# Patient Record
Sex: Female | Born: 1955 | Race: White | Hispanic: No | Marital: Married | Smoking: Former smoker
Health system: Southern US, Community
[De-identification: ages and names within clinical notes are randomized; demographics above are authoritative.]

## PROBLEM LIST (undated history)

## (undated) DIAGNOSIS — M810 Age-related osteoporosis without current pathological fracture: Secondary | ICD-10-CM

## (undated) DIAGNOSIS — C4491 Basal cell carcinoma of skin, unspecified: Secondary | ICD-10-CM

## (undated) DIAGNOSIS — R251 Tremor, unspecified: Secondary | ICD-10-CM

## (undated) DIAGNOSIS — M1990 Unspecified osteoarthritis, unspecified site: Secondary | ICD-10-CM

## (undated) DIAGNOSIS — E039 Hypothyroidism, unspecified: Secondary | ICD-10-CM

## (undated) HISTORY — DX: Basal cell carcinoma of skin, unspecified: C44.91

## (undated) HISTORY — PX: ENDOMETRIAL ABLATION: SHX621

## (undated) HISTORY — DX: Age-related osteoporosis without current pathological fracture: M81.0

## (undated) HISTORY — DX: Hypothyroidism, unspecified: E03.9

## (undated) HISTORY — DX: Unspecified osteoarthritis, unspecified site: M19.90

## (undated) HISTORY — DX: Tremor, unspecified: R25.1

---

## 2000-11-06 ENCOUNTER — Other Ambulatory Visit: Admission: RE | Admit: 2000-11-06 | Discharge: 2000-11-06 | Payer: Self-pay | Admitting: Gynecology

## 2001-11-11 ENCOUNTER — Other Ambulatory Visit: Admission: RE | Admit: 2001-11-11 | Discharge: 2001-11-11 | Payer: Self-pay | Admitting: Gynecology

## 2002-11-30 ENCOUNTER — Other Ambulatory Visit: Admission: RE | Admit: 2002-11-30 | Discharge: 2002-11-30 | Payer: Self-pay | Admitting: Gynecology

## 2003-12-09 ENCOUNTER — Other Ambulatory Visit: Admission: RE | Admit: 2003-12-09 | Discharge: 2003-12-09 | Payer: Self-pay | Admitting: Gynecology

## 2004-12-11 ENCOUNTER — Other Ambulatory Visit: Admission: RE | Admit: 2004-12-11 | Discharge: 2004-12-11 | Payer: Self-pay | Admitting: Gynecology

## 2005-12-31 ENCOUNTER — Other Ambulatory Visit: Admission: RE | Admit: 2005-12-31 | Discharge: 2005-12-31 | Payer: Self-pay | Admitting: Gynecology

## 2007-04-24 HISTORY — PX: OTHER SURGICAL HISTORY: SHX169

## 2007-07-28 ENCOUNTER — Ambulatory Visit: Payer: Self-pay | Admitting: Internal Medicine

## 2007-07-28 DIAGNOSIS — N959 Unspecified menopausal and perimenopausal disorder: Secondary | ICD-10-CM | POA: Insufficient documentation

## 2007-08-04 ENCOUNTER — Encounter (INDEPENDENT_AMBULATORY_CARE_PROVIDER_SITE_OTHER): Payer: Self-pay | Admitting: *Deleted

## 2007-08-04 ENCOUNTER — Ambulatory Visit: Payer: Self-pay | Admitting: Gastroenterology

## 2007-08-11 ENCOUNTER — Encounter: Payer: Self-pay | Admitting: Internal Medicine

## 2007-08-11 ENCOUNTER — Encounter: Payer: Self-pay | Admitting: Gastroenterology

## 2007-08-11 ENCOUNTER — Ambulatory Visit: Payer: Self-pay | Admitting: Gastroenterology

## 2007-09-16 ENCOUNTER — Telehealth (INDEPENDENT_AMBULATORY_CARE_PROVIDER_SITE_OTHER): Payer: Self-pay | Admitting: *Deleted

## 2007-10-02 ENCOUNTER — Encounter: Payer: Self-pay | Admitting: Internal Medicine

## 2007-11-20 ENCOUNTER — Telehealth (INDEPENDENT_AMBULATORY_CARE_PROVIDER_SITE_OTHER): Payer: Self-pay | Admitting: *Deleted

## 2007-12-22 ENCOUNTER — Ambulatory Visit: Payer: Self-pay | Admitting: Internal Medicine

## 2007-12-22 DIAGNOSIS — M810 Age-related osteoporosis without current pathological fracture: Secondary | ICD-10-CM | POA: Insufficient documentation

## 2007-12-22 HISTORY — DX: Age-related osteoporosis without current pathological fracture: M81.0

## 2007-12-24 ENCOUNTER — Telehealth (INDEPENDENT_AMBULATORY_CARE_PROVIDER_SITE_OTHER): Payer: Self-pay | Admitting: *Deleted

## 2007-12-30 ENCOUNTER — Telehealth (INDEPENDENT_AMBULATORY_CARE_PROVIDER_SITE_OTHER): Payer: Self-pay | Admitting: *Deleted

## 2008-01-22 ENCOUNTER — Other Ambulatory Visit: Admission: RE | Admit: 2008-01-22 | Discharge: 2008-01-22 | Payer: Self-pay | Admitting: Obstetrics and Gynecology

## 2008-09-21 ENCOUNTER — Ambulatory Visit: Payer: Self-pay | Admitting: Internal Medicine

## 2008-09-21 DIAGNOSIS — Z860100 Personal history of colon polyps, unspecified: Secondary | ICD-10-CM | POA: Insufficient documentation

## 2008-09-23 ENCOUNTER — Encounter (INDEPENDENT_AMBULATORY_CARE_PROVIDER_SITE_OTHER): Payer: Self-pay | Admitting: *Deleted

## 2009-10-03 ENCOUNTER — Ambulatory Visit: Payer: Self-pay | Admitting: Internal Medicine

## 2009-10-07 LAB — CONVERTED CEMR LAB
ALT: 19 units/L (ref 0–35)
AST: 22 units/L (ref 0–37)
Albumin: 4.1 g/dL (ref 3.5–5.2)
Alkaline Phosphatase: 54 units/L (ref 39–117)
Basophils Absolute: 0 10*3/uL (ref 0.0–0.1)
Basophils Relative: 0.7 % (ref 0.0–3.0)
Bilirubin, Direct: 0.1 mg/dL (ref 0.0–0.3)
Cholesterol: 213 mg/dL — ABNORMAL HIGH (ref 0–200)
Direct LDL: 101.9 mg/dL
Eosinophils Absolute: 0.5 10*3/uL (ref 0.0–0.7)
Eosinophils Relative: 8.3 % — ABNORMAL HIGH (ref 0.0–5.0)
HCT: 40.4 % (ref 36.0–46.0)
HDL: 90.1 mg/dL (ref >39.00)
Hemoglobin: 13.9 g/dL (ref 12.0–15.0)
Lymphocytes Relative: 29.6 % (ref 12.0–46.0)
Lymphs Abs: 1.6 10*3/uL (ref 0.7–4.0)
MCHC: 34.4 g/dL (ref 30.0–36.0)
MCV: 94.6 fL (ref 78.0–100.0)
Monocytes Absolute: 0.4 10*3/uL (ref 0.1–1.0)
Monocytes Relative: 8 % (ref 3.0–12.0)
Neutro Abs: 2.9 10*3/uL (ref 1.4–7.7)
Neutrophils Relative %: 53.4 % (ref 43.0–77.0)
Platelets: 216 10*3/uL (ref 150.0–400.0)
RBC: 4.27 M/uL (ref 3.87–5.11)
RDW: 14.7 % — ABNORMAL HIGH (ref 11.5–14.6)
TSH: 6.5 microintl units/mL — ABNORMAL HIGH (ref 0.35–5.50)
Total Bilirubin: 0.4 mg/dL (ref 0.3–1.2)
Total CHOL/HDL Ratio: 2
Total Protein: 6.1 g/dL (ref 6.0–8.3)
Triglycerides: 37 mg/dL (ref 0.0–149.0)
VLDL: 7.4 mg/dL (ref 0.0–40.0)
WBC: 5.5 10*3/uL (ref 4.5–10.5)

## 2009-10-10 ENCOUNTER — Ambulatory Visit: Payer: Self-pay | Admitting: Internal Medicine

## 2009-10-10 ENCOUNTER — Telehealth (INDEPENDENT_AMBULATORY_CARE_PROVIDER_SITE_OTHER): Payer: Self-pay | Admitting: *Deleted

## 2009-10-10 DIAGNOSIS — E039 Hypothyroidism, unspecified: Secondary | ICD-10-CM

## 2009-10-10 DIAGNOSIS — M25819 Other specified joint disorders, unspecified shoulder: Secondary | ICD-10-CM | POA: Insufficient documentation

## 2009-10-10 DIAGNOSIS — M7580 Other shoulder lesions, unspecified shoulder: Secondary | ICD-10-CM

## 2009-10-10 HISTORY — DX: Hypothyroidism, unspecified: E03.9

## 2009-10-10 LAB — CONVERTED CEMR LAB: Vit D, 25-Hydroxy: 94 ng/mL — ABNORMAL HIGH (ref 30–89)

## 2009-10-27 ENCOUNTER — Encounter: Payer: Self-pay | Admitting: Internal Medicine

## 2010-01-09 ENCOUNTER — Ambulatory Visit: Payer: Self-pay | Admitting: Internal Medicine

## 2010-01-09 ENCOUNTER — Telehealth (INDEPENDENT_AMBULATORY_CARE_PROVIDER_SITE_OTHER): Payer: Self-pay | Admitting: *Deleted

## 2010-04-27 ENCOUNTER — Encounter: Payer: Self-pay | Admitting: Internal Medicine

## 2010-04-27 ENCOUNTER — Other Ambulatory Visit: Payer: Self-pay | Admitting: Internal Medicine

## 2010-04-27 ENCOUNTER — Ambulatory Visit
Admission: RE | Admit: 2010-04-27 | Discharge: 2010-04-27 | Payer: Self-pay | Source: Home / Self Care | Attending: Internal Medicine | Admitting: Internal Medicine

## 2010-04-27 LAB — T4, FREE: Free T4: 0.86 ng/dL (ref 0.60–1.60)

## 2010-04-27 LAB — TSH: TSH: 7.66 u[IU]/mL — ABNORMAL HIGH (ref 0.35–5.50)

## 2010-04-27 LAB — T3, FREE: T3, Free: 2.5 pg/mL (ref 2.3–4.2)

## 2010-05-04 LAB — CONVERTED CEMR LAB: Vit D, 25-Hydroxy: 84 ng/mL (ref 30–89)

## 2010-05-21 LAB — CONVERTED CEMR LAB
ALT: 18 units/L (ref 0–35)
ALT: 19 units/L (ref 0–35)
AST: 23 units/L (ref 0–37)
AST: 24 units/L (ref 0–37)
Albumin: 4.1 g/dL (ref 3.5–5.2)
Albumin: 4.3 g/dL (ref 3.5–5.2)
Alkaline Phosphatase: 112 units/L (ref 39–117)
Alkaline Phosphatase: 56 units/L (ref 39–117)
BUN: 12 mg/dL (ref 6–23)
BUN: 17 mg/dL (ref 6–23)
Basophils Absolute: 0 10*3/uL (ref 0.0–0.1)
Basophils Absolute: 0 10*3/uL (ref 0.0–0.1)
Basophils Relative: 0.1 % (ref 0.0–3.0)
Basophils Relative: 0.2 % (ref 0.0–1.0)
Bilirubin, Direct: 0.1 mg/dL (ref 0.0–0.3)
Bilirubin, Direct: 0.1 mg/dL (ref 0.0–0.3)
CO2: 29 meq/L (ref 19–32)
CO2: 31 meq/L (ref 19–32)
Calcium: 9.1 mg/dL (ref 8.4–10.5)
Calcium: 9.2 mg/dL (ref 8.4–10.5)
Chloride: 108 meq/L (ref 96–112)
Chloride: 109 meq/L (ref 96–112)
Cholesterol: 180 mg/dL (ref 0–200)
Cholesterol: 213 mg/dL — ABNORMAL HIGH (ref 0–200)
Creatinine, Ser: 0.8 mg/dL (ref 0.4–1.2)
Creatinine, Ser: 0.8 mg/dL (ref 0.4–1.2)
Direct LDL: 105.9 mg/dL
Eosinophils Absolute: 0.1 10*3/uL (ref 0.0–0.7)
Eosinophils Absolute: 0.1 10*3/uL (ref 0.0–0.7)
Eosinophils Relative: 2 % (ref 0.0–5.0)
Eosinophils Relative: 2.1 % (ref 0.0–5.0)
Free T4: 0.95 ng/dL (ref 0.60–1.60)
GFR calc Af Amer: 97 mL/min
GFR calc non Af Amer: 79.94 mL/min (ref >60)
GFR calc non Af Amer: 80 mL/min
Glucose, Bld: 86 mg/dL (ref 70–99)
Glucose, Bld: 90 mg/dL (ref 70–99)
HCT: 40.6 % (ref 36.0–46.0)
HCT: 40.9 % (ref 36.0–46.0)
HDL: 89.2 mg/dL (ref >39.0)
HDL: 96.2 mg/dL (ref >39.00)
Hemoglobin: 13.6 g/dL (ref 12.0–15.0)
Hemoglobin: 13.9 g/dL (ref 12.0–15.0)
LDL Cholesterol: 82 mg/dL (ref 0–99)
Lymphocytes Relative: 36.2 % (ref 12.0–46.0)
Lymphocytes Relative: 40 % (ref 12.0–46.0)
Lymphs Abs: 2 10*3/uL (ref 0.7–4.0)
MCHC: 33.1 g/dL (ref 30.0–36.0)
MCHC: 34.2 g/dL (ref 30.0–36.0)
MCV: 93.3 fL (ref 78.0–100.0)
MCV: 95.3 fL (ref 78.0–100.0)
Monocytes Absolute: 0.4 10*3/uL (ref 0.1–1.0)
Monocytes Absolute: 0.4 10*3/uL (ref 0.1–1.0)
Monocytes Relative: 8 % (ref 3.0–12.0)
Monocytes Relative: 8.7 % (ref 3.0–12.0)
Neutro Abs: 2.6 10*3/uL (ref 1.4–7.7)
Neutro Abs: 2.8 10*3/uL (ref 1.4–7.7)
Neutrophils Relative %: 49.9 % (ref 43.0–77.0)
Neutrophils Relative %: 52.8 % (ref 43.0–77.0)
Platelets: 214 10*3/uL (ref 150.0–400.0)
Platelets: 253 10*3/uL (ref 150–400)
Potassium: 3.7 meq/L (ref 3.5–5.1)
Potassium: 3.9 meq/L (ref 3.5–5.1)
RBC: 4.3 M/uL (ref 3.87–5.11)
RBC: 4.35 M/uL (ref 3.87–5.11)
RDW: 13 % (ref 11.5–14.6)
RDW: 13.1 % (ref 11.5–14.6)
Sodium: 142 meq/L (ref 135–145)
Sodium: 144 meq/L (ref 135–145)
T3, Free: 2.5 pg/mL (ref 2.3–4.2)
TSH: 4.49 microintl units/mL (ref 0.35–5.50)
TSH: 5.33 microintl units/mL (ref 0.35–5.50)
TSH: 5.96 microintl units/mL — ABNORMAL HIGH (ref 0.35–5.50)
Total Bilirubin: 0.6 mg/dL (ref 0.3–1.2)
Total Bilirubin: 1.2 mg/dL (ref 0.3–1.2)
Total CHOL/HDL Ratio: 2
Total CHOL/HDL Ratio: 2
Total Protein: 6.8 g/dL (ref 6.0–8.3)
Total Protein: 7 g/dL (ref 6.0–8.3)
Triglycerides: 34 mg/dL (ref 0.0–149.0)
Triglycerides: 46 mg/dL (ref 0–149)
VLDL: 6.8 mg/dL (ref 0.0–40.0)
VLDL: 9 mg/dL (ref 0–40)
Vit D, 1,25-Dihydroxy: 53 (ref 30–89)
Vit D, 25-Hydroxy: 78 ng/mL (ref 30–89)
WBC: 5.1 10*3/uL (ref 4.5–10.5)
WBC: 5.1 10*3/uL (ref 4.5–10.5)

## 2010-05-23 NOTE — Assessment & Plan Note (Signed)
Summary: cpx/kdc   Vital Signs:  Patient profile:   55 year old female Height:      64.25 inches Weight:      118.4 pounds BMI:     20.24 Temp:     98.3 degrees F oral Pulse rate:   60 / minute Resp:     14 per minute BP sitting:   104 / 66  (left arm) Cuff size:   regular  Vitals Entered By: Shonna Chock (October 10, 2009 2:26 PM)  Comments REVIEWED MED LIST, PATIENT AGREED DOSE AND INSTRUCTION CORRECT    History of Present Illness: Candice Brennan is here for  a physical; she is asymptomatic.  Allergies: 1)  ! Pcn  Past History:  Past Medical History: Osteoporosis 7/ 2009: T score: -2.6 @ femoral neck (vit D level 53) Colonic polyps, hx of,2009, Dr Christella Hartigan  Past Surgical History: gravida 0, Patty Grubb,Gyn Endometriosis; laparoscopic  laser surgery, Dr Lillard Anes  [place] Colonoscopy age 36: "spastic colon" Colon polypectomy 2009 ,Dr Christella Hartigan, F/U 2019  Family History: father: thyroid issues, CAD, aneursym ( AAA) and aneurysms in  legs mother: hypertension, breast cancer, osteoporosis,hypothyroidism  sister: lymphedema ? etiology PGM: MI d 69;PGF: MI d 89; MGM :CVA;MGF :CVA  Social History: Former Smoker quit : @ age 6 Alcohol use-socially: wine with dinner Retired Married Regular exercise-yes: walking 3-4X/week X 30 min, hand weights & calisthentics  Review of Systems General:  Denies chills, fatigue, fever, sleep disorder, sweats, and weight loss. Eyes:  Denies blurring, double vision, and vision loss-both eyes. ENT:  Denies decreased hearing, difficulty swallowing, earache, hoarseness, and ringing in ears. CV:  Denies bluish discoloration of lips or nails, chest pain or discomfort, leg cramps with exertion, shortness of breath with exertion, and swelling of feet; Occasional edema of hands in heat. Resp:  Denies cough, shortness of breath, and sputum productive. GI:  Denies abdominal pain, bloody stools, constipation, dark tarry stools, diarrhea, and indigestion. GU:   Denies discharge, dysuria, and hematuria. MS:  Complains of low back pain; denies joint pain, joint redness, joint swelling, mid back pain, and thoracic pain; Decreased ROM of L shoulder. PMH of steroid injection 2000.Marland Kitchen Derm:  Denies changes in nail beds, dryness, hair loss, lesion(s), and rash. Neuro:  Denies numbness, tingling, and weakness. Psych:  Denies anxiety and depression. Endo:  Denies cold intolerance, excessive hunger, excessive thirst, excessive urination, and heat intolerance; Feet cold with cooler environments ; no Raynaud's. Heme:  Denies abnormal bruising and bleeding. Allergy:  Denies itching eyes and sneezing.  Physical Exam  General:  well-nourished, alert,appropriate and cooperative throughout examination Head:  Normocephalic and atraumatic without obvious abnormalities.  Eyes:  No corneal or conjunctival inflammation noted. Marland Kitchen Perrla. Funduscopic exam benign, without hemorrhages, exudates or papilledema.  Ears:  External ear exam shows no significant lesions or deformities.  Otoscopic examination reveals clear canals, tympanic membranes are intact bilaterally without bulging, retraction, inflammation or discharge. Hearing is grossly normal bilaterally. Nose:  External nasal examination shows no deformity or inflammation. Nasal mucosa are pink and moist without lesions or exudates. Mouth:  Oral mucosa and oropharynx without lesions or exudates.  Teeth in good repair. Neck:  No deformities, masses, or tenderness noted.Cervical rib suggested on L Lungs:  Normal respiratory effort, chest expands symmetrically. Lungs are clear to auscultation, no crackles or wheezes. Heart:  Normal rate and regular rhythm. S1 and S2 normal without gallop, murmur, click, rub. S4 Abdomen:  Bowel sounds positive,abdomen soft and non-tender without masses, organomegaly  or hernias noted. Aorta palpable w/o AAA Msk:  No deformity or scoliosis noted of thoracic or lumbar spine.   Pulses:  R and L  carotid,radial and posterior tibial pulses are full and equal bilaterally. Decreased DPP w/o ischemic changes. Extremities:  No clubbing, cyanosis, edema, or deformity noted with normal full range of motion of all joints. High arches . Excellent nail health. No crepitus L shoulder ; normal ROM  Neurologic:  alert & oriented X3, strength normal in all extremities, and DTRs symmetrical and normal.   Skin:  Intact without suspicious lesions or rashes Cervical Nodes:  No lymphadenopathy noted Axillary Nodes:  No palpable lymphadenopathy Psych:  memory intact for recent and remote, normally interactive, and good eye contact.     Impression & Recommendations:  Problem # 1:  ROUTINE GENERAL MEDICAL EXAM@HEALTH  CARE FACL (ICD-V70.0)  Orders: EKG w/ Interpretation (93000)  Problem # 2:  OSTEOPOROSIS (ICD-733.00)  Her updated medication list for this problem includes:    Fosamax 70 Mg Tabs (Alendronate sodium) .Marland Kitchen... 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 1 by mouth once daily 2)  B Complex 100 Tabs (B complex vitamins) .Marland Kitchen.. 1 by mouth once daily 3)  Vitamin C 500 Mg Tabs (Ascorbic acid) .Marland Kitchen.. 1 by mouth once daily 4)  Calcium 600/vitamin D 600-400 Mg-unit Tabs (Calcium carbonate-vitamin d) .... 2 by mouth once daily 5)  Vitamin D3 2000 Unit Caps (Cholecalciferol) .Marland Kitchen.. 1 by mouth once daily 6)  Vitamin E 400 Unit Caps (Vitamin e) .Marland Kitchen.. 1 by mouth once daily 7)  Fish Oil 1000 Mg Caps (Omega-3 fatty acids) .... 2 by mouth once daily 8)  Estroven Maximum Strength  Tabs (Nutritional supplements) .Marland Kitchen.. 1 by mouth two times a day 9)  Coq10 100 Mg Caps (coenzyme Q10)/ubiguinol  .Marland Kitchen.. 1 by mouth once daily 10)  Ibuprofen 200 Mg Tabs (Ibuprofen) .Marland Kitchen.. 1 by mouth once daily as needed 11)  Edamame(dry Roasted Soy Beans)  12)  Fosamax 70 Mg Tabs (Alendronate sodium) .Marland Kitchen.. 1 every week 13)  Ocuvite-lutein Caps (Multiple vitamins-minerals) .Marland Kitchen.. 1 by mouth once daily 14)  Triple Flex 750-400-375 Mg Tabs (Glucosamine-chondroitin-msm) .... 2 by mouth once daily  Patient Instructions: 1)  Schedule BMD with mammogram . Glucosamine sulfate 1500 mg once daily ; 3 months on &  then 2 months off.Physical Therapy if shoulder  no better.

## 2010-05-23 NOTE — Assessment & Plan Note (Signed)
Summary: FLU SHOT///SPH  Nurse Visit   Allergies: 1)  ! Pcn  Orders Added: 1)  Admin 1st Vaccine [90471] 2)  Flu Vaccine 86yrs + [16109] Flu Vaccine Consent Questions     Do you have a history of severe allergic reactions to this vaccine? no    Any prior history of allergic reactions to egg and/or gelatin? no    Do you have a sensitivity to the preservative Thimersol? no    Do you have a past history of Guillan-Barre Syndrome? no    Do you currently have an acute febrile illness? no    Have you ever had a severe reaction to latex? no    Vaccine information given and explained to patient? yes    Are you currently pregnant? no    Lot Number:[id number]   Exp Date:10/20/2009   Site Given  Left Deltoid IMu Vaccine 69yrs + [60454] .lbflu

## 2010-05-23 NOTE — Progress Notes (Signed)
----   Converted from flag ---- ---- 10/10/2009 9:18 AM, Okey Regal [place] wrote: patient has appt (med refill)  [id number]  ---- 10/07/2009 4:17 PM, Marga Melnick MD wrote: please verify F/U appt ------------------------------

## 2010-05-23 NOTE — Progress Notes (Signed)
Summary: Triage: ? about supplement  Phone Note Call from Patient Call back at Home Phone [phone]   Caller: Patient Summary of Call: Patient would like to know what Dr.Hopper thinks about the Supplement Trigosamine   Dr.Hopper please advise./Chrae [facility] CMA  January 09, 2010 8:59 AM   Follow-up for Phone Call        I have to admit ignorance. Consider consulting WebMD Follow-up by: Marga Melnick MD,  January 09, 2010 1:15 PM  Additional Follow-up for Phone Call Additional follow up Details #1::        left message on machine .....Marland KitchenMarland KitchenDoristine Devoid CMA  January 09, 2010 4:29 PM   spoke w/ patient aware of recommendations.......Marland KitchenDoristine Devoid CMA  January 10, 2010 10:52 AM

## 2010-05-29 ENCOUNTER — Ambulatory Visit (INDEPENDENT_AMBULATORY_CARE_PROVIDER_SITE_OTHER): Payer: BC Managed Care – PPO | Admitting: Internal Medicine

## 2010-05-29 ENCOUNTER — Encounter: Payer: Self-pay | Admitting: Internal Medicine

## 2010-05-29 DIAGNOSIS — R946 Abnormal results of thyroid function studies: Secondary | ICD-10-CM

## 2010-06-08 NOTE — Assessment & Plan Note (Signed)
Summary: discuss labs///sph  Nurse Visit   Vital Signs:  Patient profile:   55 year old female Weight:      119.2 pounds BMI:     20.38 Pulse rate:   64 / minute Resp:     14 per minute BP sitting:   98 / 66  (left arm) Cuff size:   regular  Vitals Entered By: Shonna Chock CMA (May 29, 2010 2:00 PM)  CC:  Discuss labs (patient with mailed copy).  History of Present Illness:    Candice Brennan lost her father CAD  in 03/2010; diet  & CVE have  been suboptimal. Labs reviewed ; TSH is in HYPOthyroid range; it has climbed from 5.96 to 7.66. See ROS. She feels most of her symptoms are due to menopause.   Review of Systems General:  Complains of fatigue; denies weight loss. Eyes:  Denies blurring, double vision, and vision loss-both eyes. ENT:  Denies difficulty swallowing and hoarseness. CV:  Denies palpitations. GI:  Denies constipation and diarrhea. Derm:  Complains of hair loss; denies changes in nail beds and dryness. Neuro:  Denies numbness and tingling. Endo:  Denies cold intolerance and heat intolerance.   Physical Exam  General:  Thin but well-nourished; alert,appropriate and cooperative throughout examination Eyes:  No corneal or conjunctival inflammation noted. No lid lag Neck:  No deformities, masses, or tenderness noted. Physiologic asymmetry of thyroid w/o nodules Heart:  Normal rate and regular rhythm. S1 and S2 normal without gallop, murmur, click, rub . S4 Extremities:  trace left pedal edema and trace right pedal edema.   Neurologic:  alert & oriented X3 and DTRs symmetrical and normal.   No tremor Skin:  Intact without suspicious lesions or rashes Psych:  memory intact for recent and remote, normally interactive, and good eye contact.     Impression & Recommendations:  Problem # 1:  THYROID FUNCTION TEST, ABNORMAL (ICD-794.5) progressive  rise in TSH  Complete Medication List: 1)  Multivitamins Tabs (Multiple vitamin) .Marland Kitchen.. 1 by mouth once daily 2)   Vitamin B-12 1000 Mcg Tabs (Cyanocobalamin) .Marland Kitchen.. 1 by mouth once daily 3)  Vitamin C 500 Mg Tabs (Ascorbic acid) .Marland Kitchen.. 1 by mouth once daily 4)  Calcium 600/vitamin D 600-400 Mg-unit Tabs (Calcium carbonate-vitamin d) .... 2 by mouth once daily 5)  Vitamin D3 1000 Unit Tabs (Cholecalciferol) .Marland Kitchen.. 1 by mouth once daily 6)  Fish Oil 1000 Mg Caps (Omega-3 fatty acids) .... 2 by mouth once daily 7)  Estroven Maximum Strength Tabs (Nutritional supplements) .Marland Kitchen.. 1 by mouth once daily 8)  Coq10 100 Mg Caps (coenzyme Q10)/ubiguinol  .Marland Kitchen.. 1 by mouth once daily 9)  Fosamax 70 Mg Tabs (Alendronate sodium) .Marland Kitchen.. 1 every week 10)  Ocuvite-lutein Caps (Multiple vitamins-minerals) .Marland Kitchen.. 1 by mouth once daily 11)  Triple Flex 750-400-375 Mg Tabs (Glucosamine-chondroitin-msm) .... 2 by mouth once daily 12)  Levothroid 25 Mcg Tabs (Levothyroxine sodium) .Marland Kitchen.. 1 once daily    Patient Instructions: 1)  Consider low dose thyroid replacement  with TSH after  8-10 weeks (244.9) .If  not taken  AT LEAST monitor TFTs every 4 months.  CC: Discuss labs (patient with mailed copy)   Current Medications (verified): 1)  Multivitamins  Tabs (Multiple Vitamin) .Marland Kitchen.. 1 By Mouth Once Daily 2)  Vitamin B-12 1000 Mcg Tabs (Cyanocobalamin) .Marland Kitchen.. 1 By Mouth Once Daily 3)  Vitamin C 500 Mg Tabs (Ascorbic Acid) .Marland Kitchen.. 1 By Mouth Once Daily 4)  Calcium 600/vitamin D 600-400 Mg-Unit Tabs (  Calcium Carbonate-Vitamin D) .... 2 By Mouth Once Daily 5)  Vitamin D3 1000 Unit Tabs (Cholecalciferol) .Marland Kitchen.. 1 By Mouth Once Daily 6)  Fish Oil 1000 Mg Caps (Omega-3 Fatty Acids) .... 2 By Mouth Once Daily 7)  Estroven Maximum Strength  Tabs (Nutritional Supplements) .Marland Kitchen.. 1 By Mouth Once Daily 8)  Coq10 100 Mg Caps (Coenzyme Q10)/ubiguinol .Marland Kitchen.. 1 By Mouth Once Daily 9)  Fosamax 70 Mg Tabs (Alendronate Sodium) .Marland Kitchen.. 1 Every Week 10)  Ocuvite-Lutein  Caps (Multiple Vitamins-Minerals) .Marland Kitchen.. 1 By Mouth Once Daily 11)  Triple Flex 750-400-375 Mg Tabs  (Glucosamine-Chondroitin-Msm) .... 2 By Mouth Once Daily  Allergies: 1)  ! Pcn  Orders Added: 1)  Est. Patient Level III [04540] Prescriptions: LEVOTHROID 25 MCG TABS (LEVOTHYROXINE SODIUM) 1 once daily  #90 x 0   Entered and Authorized by:   Marga Melnick MD   Signed by:   Marga Melnick MD on 05/29/2010   Method used:   Print then Give to Patient   RxID:   [id number]

## 2010-08-18 ENCOUNTER — Other Ambulatory Visit: Payer: Self-pay | Admitting: Internal Medicine

## 2010-08-24 ENCOUNTER — Other Ambulatory Visit (INDEPENDENT_AMBULATORY_CARE_PROVIDER_SITE_OTHER): Payer: BC Managed Care – PPO

## 2010-08-24 DIAGNOSIS — E039 Hypothyroidism, unspecified: Secondary | ICD-10-CM

## 2010-08-24 LAB — TSH: TSH: 4.07 u[IU]/mL (ref 0.35–5.50)

## 2010-11-01 ENCOUNTER — Other Ambulatory Visit: Payer: Self-pay | Admitting: Internal Medicine

## 2010-11-01 NOTE — Telephone Encounter (Signed)
TSH 244.9 

## 2011-02-01 ENCOUNTER — Ambulatory Visit (INDEPENDENT_AMBULATORY_CARE_PROVIDER_SITE_OTHER): Payer: BC Managed Care – PPO

## 2011-02-01 DIAGNOSIS — Z23 Encounter for immunization: Secondary | ICD-10-CM

## 2011-05-08 ENCOUNTER — Other Ambulatory Visit: Payer: Self-pay | Admitting: Internal Medicine

## 2011-05-08 NOTE — Telephone Encounter (Signed)
TSH 244.9 

## 2011-06-15 ENCOUNTER — Other Ambulatory Visit: Payer: Self-pay | Admitting: Internal Medicine

## 2011-07-17 ENCOUNTER — Other Ambulatory Visit: Payer: Self-pay | Admitting: Internal Medicine

## 2011-07-18 NOTE — Telephone Encounter (Signed)
TSH 244.9 

## 2011-08-17 ENCOUNTER — Other Ambulatory Visit: Payer: Self-pay | Admitting: Internal Medicine

## 2011-08-20 NOTE — Telephone Encounter (Signed)
TSH 244.9 

## 2011-09-18 ENCOUNTER — Other Ambulatory Visit: Payer: Self-pay | Admitting: Internal Medicine

## 2011-10-04 ENCOUNTER — Encounter: Payer: Self-pay | Admitting: Internal Medicine

## 2011-10-04 ENCOUNTER — Ambulatory Visit (INDEPENDENT_AMBULATORY_CARE_PROVIDER_SITE_OTHER): Payer: BC Managed Care – PPO | Admitting: Internal Medicine

## 2011-10-04 VITALS — BP 100/60 | HR 72 | Temp 98.0°F | Resp 16 | Ht 64.0 in | Wt 112.6 lb

## 2011-10-04 DIAGNOSIS — R946 Abnormal results of thyroid function studies: Secondary | ICD-10-CM

## 2011-10-04 DIAGNOSIS — Z0000 Encounter for general adult medical examination without abnormal findings: Secondary | ICD-10-CM

## 2011-10-04 DIAGNOSIS — M810 Age-related osteoporosis without current pathological fracture: Secondary | ICD-10-CM

## 2011-10-04 NOTE — Progress Notes (Signed)
  Subjective:    Patient ID: Candice Brennan, female    DOB: Dec 24, 1955, 56 y.o.   MRN: [id number]  HPI  Mrs. Poteat is here for a physical;acute issues include grinding in L jaw. She questions relationship to Fosamax.      Review of Systems Thyroid function monitor  Medications status(change in dose/brand/mode of administration):no Constitutional: Weight change:down 3 #; Fatigue:no; Sleep pattern:yes; Appetite:yes  Visual change(blurred/diplopia/visual loss):no Hoarseness:no; Swallowing issues:no Cardiovascular: Palpitations:no; Racing:no; Irregularity:no GI: Constipation:no; Diarrhea:no Derm: Change in nails/hair/skin:no Neuro: Numbness/tingling:no; Tremor:no Psych: Anxiety:concerned about mother's health due to repeated falls, MRSA, renal failure; Depression:no Endo: Temperature intolerance: Heat:no; Cold:yes       Objective:   Physical Exam Gen.: Thin but healthy and well-nourished in appearance. Alert, appropriate and cooperative throughout exam. Head: Normocephalic without obvious abnormalities  Eyes: No corneal or conjunctival inflammation noted. Pupils equal round reactive to light and accommodation. Fundal exam is benign without hemorrhages, exudate, papilledema. Extraocular motion intact. Vision grossly normal with lenses. Ears: External  ear exam reveals no significant lesions or deformities. Canals clear .TMs normal. Hearing is grossly normal bilaterally. Nose: External nasal exam reveals no deformity or inflammation. Nasal mucosa are pink and moist. No lesions or exudates noted.  Mouth: Oral mucosa and oropharynx reveal no lesions or exudates. Teeth in good repair. Mild classic dislocation of left temporal mandibular joint with mouth opening Neck: No deformities, masses, or tenderness noted. Range of motion & Thyroid normal. Lungs: Normal respiratory effort; chest expands symmetrically. Lungs are clear to auscultation without rales, wheezes, or increased work of  breathing. Heart: Normal rate and rhythm. Normal S1 and S2. No gallop,  or rub. Loud click the apex intermittently; no murmur or regurgitation.  Abdomen: Bowel sounds normal; abdomen soft and nontender. No masses, organomegaly or hernias noted. Genitalia: Shirlyn Goltz, NP                                                               Musculoskeletal/extremities: No clubbing, cyanosis, edema, or deformity noted. Range of motion  normal .Tone & strength  normal.Joints normal. Nail health  good. Vascular: Carotid, radial artery, dorsalis pedis and  posterior tibial pulses are full and equal. No bruits present. Neurologic: Alert and oriented x3. Deep tendon reflexes symmetrical and normal.          Skin: Intact without suspicious lesions or rashes. Lymph: No cervical, axillary lymphadenopathy present. Psych: Mood and affect are normal. 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 This is not related to the Fosamax. Plan: see Orders

## 2011-10-04 NOTE — Patient Instructions (Addendum)
Preventive Health Care: Exercise  30-45  minutes a day, 3-4 days a week. Walking is especially valuable in preventing Osteoporosis. Eat a low-fat diet with lots of fruits and vegetables, up to 7-9 servings per day. Consume less than 30 grams of sugar per day from foods & drinks with High Fructose Corn Syrup as # 1,2,3 or #4 on label. Go to WebMD for information about temporomandibular joint dysfunction.Consider glucosamine sulfate 1500 mg daily for TMJ symptoms. Take this daily  for 3 months and then leave it off for 2 months. This will rehydrate the cartilage @ the joint.  Please try to go on My Chart within the next 24 hours to allow me to release the results directly to you.

## 2011-10-05 ENCOUNTER — Other Ambulatory Visit: Payer: Self-pay | Admitting: *Deleted

## 2011-10-05 ENCOUNTER — Ambulatory Visit (INDEPENDENT_AMBULATORY_CARE_PROVIDER_SITE_OTHER)
Admission: RE | Admit: 2011-10-05 | Discharge: 2011-10-05 | Disposition: A | Discharge status: Home / Self Care | Payer: BC Managed Care – PPO | Source: Ambulatory Visit | Attending: Internal Medicine | Admitting: Internal Medicine

## 2011-10-05 DIAGNOSIS — M810 Age-related osteoporosis without current pathological fracture: Secondary | ICD-10-CM

## 2011-10-05 LAB — CBC WITH DIFFERENTIAL/PLATELET
Basophils Absolute: 0 10*3/uL (ref 0.0–0.1)
HCT: 39.9 % (ref 36.0–46.0)
Lymphs Abs: 2 10*3/uL (ref 0.7–4.0)
MCHC: 32.9 g/dL (ref 30.0–36.0)
MCV: 93.8 fl (ref 78.0–100.0)
Monocytes Absolute: 0.7 10*3/uL (ref 0.1–1.0)
Platelets: 216 10*3/uL (ref 150.0–400.0)
RDW: 14.3 % (ref 11.5–14.6)

## 2011-10-05 LAB — LIPID PANEL
Cholesterol: 191 mg/dL (ref 0–200)
LDL Cholesterol: 79 mg/dL (ref 0–99)
Triglycerides: 52 mg/dL (ref 0.0–149.0)
VLDL: 10.4 mg/dL (ref 0.0–40.0)

## 2011-10-05 LAB — BASIC METABOLIC PANEL
BUN: 19 mg/dL (ref 6–23)
Chloride: 104 mEq/L (ref 96–112)
Glucose, Bld: 74 mg/dL (ref 70–99)
Potassium: 3.9 mEq/L (ref 3.5–5.1)

## 2011-10-05 LAB — HEPATIC FUNCTION PANEL
ALT: 17 U/L (ref 0–35)
Total Bilirubin: 0.7 mg/dL (ref 0.3–1.2)

## 2011-10-05 NOTE — Progress Notes (Signed)
Christine from [employer] called regarding patient in office to have DEXA and Order entry wrong in EMR & no appointment in system, inform that [facility] had been out of office & regular nurse for MD has also been out of office this week. Radiology at [facility] requesting call back from OM [Nancy] to discuss this matter as it seems to be an ongoing problem; OM informed/SLS

## 2012-01-11 ENCOUNTER — Other Ambulatory Visit: Payer: Self-pay | Admitting: Internal Medicine

## 2012-01-16 ENCOUNTER — Other Ambulatory Visit: Payer: Self-pay | Admitting: Internal Medicine

## 2012-01-17 ENCOUNTER — Other Ambulatory Visit: Payer: Self-pay

## 2012-01-17 MED ORDER — ALENDRONATE SODIUM 70 MG PO TABS: 70.0000 mg | ORAL_TABLET | ORAL | Status: DC

## 2012-01-17 NOTE — Telephone Encounter (Signed)
Deleted the Rx so it wouldn't be sent twice.   MW

## 2012-01-17 NOTE — Telephone Encounter (Signed)
Rx sent.    MW 

## 2012-01-17 NOTE — Telephone Encounter (Signed)
Plz close encounter for me I already filled Rx request and this is duplicate encounter. Thx   MW

## 2012-01-23 LAB — HM MAMMOGRAPHY

## 2012-02-05 ENCOUNTER — Ambulatory Visit (INDEPENDENT_AMBULATORY_CARE_PROVIDER_SITE_OTHER): Payer: BC Managed Care – PPO

## 2012-02-05 DIAGNOSIS — Z23 Encounter for immunization: Secondary | ICD-10-CM

## 2012-03-15 ENCOUNTER — Other Ambulatory Visit: Payer: Self-pay | Admitting: Internal Medicine

## 2012-05-29 ENCOUNTER — Telehealth: Payer: Self-pay | Admitting: Internal Medicine

## 2012-05-29 NOTE — Telephone Encounter (Signed)
Okay; she needs to document the dates that she has taken the medication for future reference concerning her bone integrity and progression of any bone loss.

## 2012-05-29 NOTE — Telephone Encounter (Signed)
Discuss with patient  

## 2012-05-29 NOTE — Telephone Encounter (Signed)
Patient states she believes her jaw problems are caused by fosamax and would like to stop taking it. She is scheduled to take it tomorrow morning and she does not want to take it. Patient would like to know if that is okay.

## 2012-10-13 ENCOUNTER — Other Ambulatory Visit: Payer: Self-pay | Admitting: Internal Medicine

## 2012-11-08 ENCOUNTER — Other Ambulatory Visit: Payer: Self-pay | Admitting: Internal Medicine

## 2012-12-29 ENCOUNTER — Encounter: Payer: Self-pay | Admitting: Internal Medicine

## 2012-12-29 ENCOUNTER — Ambulatory Visit (INDEPENDENT_AMBULATORY_CARE_PROVIDER_SITE_OTHER): Payer: BC Managed Care – PPO | Admitting: Internal Medicine

## 2012-12-29 VITALS — BP 107/71 | HR 72 | Temp 97.6°F | Ht 64.25 in | Wt 117.4 lb

## 2012-12-29 DIAGNOSIS — Z8669 Personal history of other diseases of the nervous system and sense organs: Secondary | ICD-10-CM

## 2012-12-29 DIAGNOSIS — Z0000 Encounter for general adult medical examination without abnormal findings: Secondary | ICD-10-CM

## 2012-12-29 MED ORDER — LEVOTHYROXINE SODIUM 25 MCG PO TABS: ORAL_TABLET | ORAL | Status: DC

## 2012-12-29 NOTE — Patient Instructions (Addendum)
If you activate the  My Chart system; lab & Xray results will be released directly  to you as soon as I review & address these through the computer. If you choose not to sign up for My Chart within 36 hours of labs being drawn; results will be reviewed & interpretation added before being copied & mailed, causing a delay in getting the results to you.If you do not receive that report within 7-10 days ,please call. Additionally you can use this system to gain direct  access to your records  if  out of town or @ an office of a  physician who is not in  the My Chart network.  This improves continuity of care & places you in control of your medical record.  To prevent intention tremor, avoid stimulants such as decongestants, diet pills, nicotine, or caffeine (coffee, tea, cola, or chocolate) to excess.

## 2012-12-29 NOTE — Progress Notes (Signed)
Subjective:    Patient ID: Candice Brennan, female    DOB: July 10, 1955, 57 y.o.   MRN: [id number]  HPI Candice Brennan is here for a physical;acute issues include intermittent tremor of her head and also some fine tremor with intentional hand activity intermittently. She questions whether these might be related to her thyroid.     Review of Systems  There has been no change in the dose brand mode of administration of thyroid supplement  Constitutional: No significant change in weight; significant fatigue; sleep disorder; change in appetite. Eye: no blurred, double ,loss of vision Cardiovascular: no palpitations; rare racing @ rest ENT/GI: no constipation; diarrhea;hoarseness;dysphagia Derm: no change in nails,hair,skin Neuro: no numbness or tingling Psych:no anxiety; depression; panic attacks Endo: no temperature intolerance to heat ,cold       Objective:   Physical Exam Gen.: Thin but healthy and well-nourished in appearance. Alert, appropriate and cooperative throughout exam.Appears younger than stated age  Head: Normocephalic without obvious abnormalities  Eyes: No corneal or conjunctival inflammation noted. Pupils equal round reactive to light and accommodation.  Extraocular motion intact. Vision grossly normal with lenses Ears: External  ear exam reveals no significant lesions or deformities. Canals clear .TMs normal. Hearing is grossly normal bilaterally. Nose: External nasal exam reveals no deformity or inflammation. Nasal mucosa are pink and moist. No lesions or exudates noted.   Mouth: Oral mucosa and oropharynx reveal no lesions or exudates. Teeth in good repair. Neck: No deformities, masses, or tenderness noted. Range of motion & Thyroid normal. Lungs: Normal respiratory effort; chest expands symmetrically. Lungs are clear to auscultation without rales, wheezes, or increased work of breathing. Heart: Normal rate and rhythm. Normal S1 and S2. No gallop, click, or rub. No  murmur. Abdomen: Bowel sounds normal; abdomen soft and nontender. No masses, organomegaly or hernias noted.Aorta palpable ; no AAA Genitalia: As per Gyn                                  Musculoskeletal/extremities: No deformity or scoliosis noted of  the thoracic or lumbar spine.  No clubbing, cyanosis, edema, or significant extremity  deformity noted. Range of motion normal .Tone & strength  Normal. Joints normal . Minor knee crepitus. Nail health good; no onycholysis. Able to lie down & sit up w/o help. Negative SLR bilaterally Vascular: Carotid, radial artery, dorsalis pedis and  posterior tibial pulses are full and equal. No bruits present. Neurologic: Alert and oriented x3. Deep tendon reflexes symmetrical and normal.        Skin: Intact without suspicious lesions or rashes. Lymph: Pea sized  L cervical chain but no axillary lymphadenopathy present. Psych: Mood and affect are normal. 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 Potential risk discussed  Plan: see Orders  & Recommendations

## 2012-12-30 ENCOUNTER — Other Ambulatory Visit (INDEPENDENT_AMBULATORY_CARE_PROVIDER_SITE_OTHER): Payer: BC Managed Care – PPO

## 2012-12-30 DIAGNOSIS — Z0000 Encounter for general adult medical examination without abnormal findings: Secondary | ICD-10-CM

## 2012-12-30 LAB — HEPATIC FUNCTION PANEL
ALT: 27 U/L (ref 0–35)
AST: 31 U/L (ref 0–37)
Albumin: 4.3 g/dL (ref 3.5–5.2)
Total Bilirubin: 0.7 mg/dL (ref 0.3–1.2)
Total Protein: 6.4 g/dL (ref 6.0–8.3)

## 2012-12-30 LAB — LDL CHOLESTEROL, DIRECT: Direct LDL: 101.5 mg/dL

## 2012-12-30 LAB — LIPID PANEL
Cholesterol: 218 mg/dL — ABNORMAL HIGH (ref 0–200)
Total CHOL/HDL Ratio: 2

## 2013-01-02 ENCOUNTER — Encounter: Payer: Self-pay | Admitting: Internal Medicine

## 2013-01-02 LAB — VITAMIN D 1,25 DIHYDROXY
Vitamin D2 1, 25 (OH)2: <8 pg/mL
Vitamin D3 1, 25 (OH)2: 50 pg/mL

## 2013-01-05 ENCOUNTER — Telehealth: Payer: Self-pay | Admitting: *Deleted

## 2013-01-05 ENCOUNTER — Encounter: Payer: Self-pay | Admitting: *Deleted

## 2013-01-05 NOTE — Telephone Encounter (Signed)
Message copied by Baldwin [place] on Mon Jan 05, 2013  3:09 PM ------      Message from: Pecola Lawless      Created: Fri Jan 02, 2013  5:44 PM       LDL or BAD cholesterol is MINIMALLY elevated , but the HDL (GOOD) > 50 is protective.       TSH (Thyroid Stimulating Hormone) normal range = 0.35- 5.50. Ideal value is 1-3.        A  Value  > 5.50 indicates HYPOthyroid state from decreased production of thyroid gland  hormones or  suboptimal thyroid hormone replacement. Verify your present dose of thyroid is 25 mcg daily. Increase this to one daily except one & one half on Tuesday and Thursday. Recheck TSH in 4-6 months. Code 244.9.      All other lab results are excellent. Hopp       ------

## 2013-01-05 NOTE — Telephone Encounter (Signed)
Letter with lab results sent as well as message left on voice mail

## 2013-01-06 ENCOUNTER — Telehealth: Payer: Self-pay | Admitting: Internal Medicine

## 2013-01-06 NOTE — Telephone Encounter (Signed)
Patient called stating she needs [place] to send new rx for levothyroxine with new directions (see lab results). She uses [facility] in [place].

## 2013-01-08 ENCOUNTER — Other Ambulatory Visit: Payer: Self-pay | Admitting: *Deleted

## 2013-01-08 DIAGNOSIS — E039 Hypothyroidism, unspecified: Secondary | ICD-10-CM

## 2013-01-08 MED ORDER — LEVOTHYROXINE SODIUM 25 MCG PO TABS: ORAL_TABLET | ORAL | Status: DC

## 2013-01-08 NOTE — Telephone Encounter (Signed)
Refill for synthroid sent to [facility], detailed message left on pt home voice mail with instructions on taking 1 tab daily except on Tue and Thur when she is to take 1.5 tablets.

## 2013-02-05 ENCOUNTER — Ambulatory Visit (INDEPENDENT_AMBULATORY_CARE_PROVIDER_SITE_OTHER): Payer: BC Managed Care – PPO

## 2013-02-05 DIAGNOSIS — Z23 Encounter for immunization: Secondary | ICD-10-CM

## 2013-05-05 ENCOUNTER — Other Ambulatory Visit (INDEPENDENT_AMBULATORY_CARE_PROVIDER_SITE_OTHER): Payer: BC Managed Care – PPO

## 2013-05-05 DIAGNOSIS — E889 Metabolic disorder, unspecified: Secondary | ICD-10-CM

## 2013-05-06 LAB — TSH: TSH: 5.11 u[IU]/mL (ref 0.35–5.50)

## 2013-07-21 ENCOUNTER — Encounter: Payer: Self-pay | Admitting: Internal Medicine

## 2013-07-21 ENCOUNTER — Ambulatory Visit (INDEPENDENT_AMBULATORY_CARE_PROVIDER_SITE_OTHER): Payer: BC Managed Care – PPO | Admitting: Internal Medicine

## 2013-07-21 VITALS — BP 113/70 | HR 75 | Temp 97.9°F | Ht 64.0 in | Wt 118.0 lb

## 2013-07-21 DIAGNOSIS — R259 Unspecified abnormal involuntary movements: Secondary | ICD-10-CM

## 2013-07-21 DIAGNOSIS — R251 Tremor, unspecified: Secondary | ICD-10-CM

## 2013-07-21 DIAGNOSIS — R946 Abnormal results of thyroid function studies: Secondary | ICD-10-CM

## 2013-07-21 HISTORY — DX: Tremor, unspecified: R25.1

## 2013-07-21 NOTE — Progress Notes (Signed)
Pre visit review using our clinic review tool, if applicable. No additional management support is needed unless otherwise documented below in the visit note. 

## 2013-07-21 NOTE — Assessment & Plan Note (Signed)
Mild hypothyroidism, on replacement, TSH goal 3.0 (discussed goal of TSH 1.0 but  patient does not like to take "too much medication")

## 2013-07-21 NOTE — Assessment & Plan Note (Signed)
One-year history of action tremor, not affecting her activities of daily living feeding, neurological exam today is essentially normal. No much evidence of Parkinson disease. Likely she has essential tremor. Diagnosis and prognosis discussed. Plan: Observation for now.

## 2013-07-21 NOTE — Patient Instructions (Signed)
Get your blood work before you leave   Next visit is for routine check up regards your  thyroid  in 4 months  No need to come back fasting Please make an appointment    Tremor Tremor is a rhythmic, involuntary muscular contraction characterized by oscillations (to-and-fro movements) of a part of the body. The most common of all involuntary movements, tremor can affect various body parts such as the hands, head, facial structures, vocal cords, trunk, and legs; most tremors, however, occur in the hands. Tremor often accompanies neurological disorders associated with aging. Although the disorder is not life-threatening, it can be responsible for functional disability and social embarrassment. TREATMENT  There are many types of tremor and several ways in which tremor is classified. The most common classification is by behavioral context or position. There are five categories of tremor within this classification: resting, postural, kinetic, task-specific, and psychogenic. Resting or static tremor occurs when the muscle is at rest, for example when the hands are lying on the lap. This type of tremor is often seen in patients with Parkinson's disease. Postural tremor occurs when a patient attempts to maintain posture, such as holding the hands outstretched. Postural tremors include physiological tremor, essential tremor, tremor with basal ganglia disease (also seen in patients with Parkinson's disease), cerebellar postural tremor, tremor with peripheral neuropathy, post-traumatic tremor, and alcoholic tremor. Kinetic or intention (action) tremor occurs during purposeful movement, for example during finger-to-nose testing. Task-specific tremor appears when performing goal-oriented tasks such as handwriting, speaking, or standing. This group consists of primary writing tremor, vocal tremor, and orthostatic tremor. Psychogenic tremor occurs in both older and younger patients. The key feature of this tremor is that it  dramatically lessens or disappears when the patient is distracted. PROGNOSIS There are some treatment options available for tremor; the appropriate treatment depends on accurate diagnosis of the cause. Some tremors respond to treatment of the underlying condition, for example in some cases of psychogenic tremor treating the patient's underlying mental problem may cause the tremor to disappear. Also, patients with tremor due to Parkinson's disease may be treated with Levodopa drug therapy. Symptomatic drug therapy is available for several other tremors as well. For those cases of tremor in which there is no effective drug treatment, physical measures such as teaching the patient to brace the affected limb during the tremor are sometimes useful. Surgical intervention such as thalamotomy or deep brain stimulation may be useful in certain cases. Document Released: 03/30/2002 Document Revised: 07/02/2011 Document Reviewed: 04/09/2005 [facility] Patient Information 2014 [place].

## 2013-07-21 NOTE — Progress Notes (Signed)
Subjective:    Patient ID: Candice Brennan, female    DOB: 1955-08-03, 58 y.o.   MRN: [id number]  DOS:  07/21/2013 Type of  visit:    New patient, transferring from Dr. Linna Darner Thyroid disease, on Synthroid, last TSH elevated and dose was adjusted. Good compliance Also one-year history of tremor, mostly in her hands and the head, only with action, only when she does something tedious or use  her hands a lot. Not affecting her activities of daily living. She admits to stress, mom is at a assisted-living facility, stress seems to increase the tremor. Denies depression or gait disturbances per se.    ROS   Past Medical History  Diagnosis Date  . Osteopenia     -2.4 @ L fem neck  . OSTEOPOROSIS 12/22/2007    Qualifier: Diagnosis of  By: Linna Darner MD, Gwyndolyn Saxon  10/05/2011 : T score - 2.4 L femoral neck BMD @ [place] Fosamax initiated 08/ 2009 ; stopped 05/2012 Family history of osteoporosis in her mother and 2 maternal aunts.    . Tremor 07/21/2013    Past Surgical History  Procedure Laterality Date  . Endometrial ablation      laparoscopic  . Colonoscopy with polypectomy  2009    Dr Ardis Hughs     History   Social History  . Marital Status: Married    Spouse Name: N/A    Number of Children: 0  . Years of Education: N/A   Occupational History  . retired -- Pharmacist, community     Social History Main Topics  . Smoking status: Former Smoker    Quit date: 04/24/1983  . Smokeless tobacco: Never Used     Comment: smoked [place], up to 1 ppd  . Alcohol Use: Yes     Comment:  occasionally  . Drug Use: Not on file  . Sexual Activity: Not on file   Other Topics Concern  . Not on file   Social History Narrative   Has 1 step daughter         Medication List       This list is accurate as of: 07/21/13  5:37 PM.  Always use your most recent med list.               Calcium Carbonate-Vit D-Min 600-400 MG-UNIT Tabs  Take 2 tablets by mouth daily.     cholecalciferol  1000 UNITS tablet  Commonly known as:  VITAMIN D  Take 1,000 Units by mouth daily.     Cinnamon 500 MG Tabs  Take 1 tablet by mouth 2 (two) times daily.     fish oil-omega-3 fatty acids 1000 MG capsule  Take 2 g by mouth daily.     levothyroxine 25 MCG tablet  Commonly known as:  SYNTHROID, LEVOTHROID  TAKE ONE TABLET BY MOUTH ONCE DAILY, Tuesday-Thursday takes 1.5 tablets     LUTEIN-ZEAXANTHIN PO  Take 1 tablet by mouth daily.     multivitamin tablet  Take 1 tablet by mouth daily.     NON FORMULARY  Take 1 tablet by mouth at bedtime. GHR Take as directed.     NON FORMULARY  Take 2 tablets by mouth every morning. Omega Q Plus     vitamin C 1000 MG tablet  Take 1,000 mg by mouth daily.     vitamin E 400 UNIT capsule  Take 400 Units by mouth daily.           Objective:   Physical  Exam BP 113/70  Pulse 75  Temp(Src) 97.9 F (36.6 C)  Ht 5\' 4"  (1.626 m)  Wt 118 lb (53.524 kg)  BMI 20.24 kg/m2  SpO2 98%  General -- alert, well-developed, NAD.  Neck --no thyromegaly  HEENT-- Not pale  Lungs -- normal respiratory effort, no intercostal retractions, no accessory muscle use, and normal breath sounds.  Heart-- normal rate, regular rhythm, no murmur.  Extremities-- no pretibial edema bilaterally  Neurologic--  alert & oriented X3. Speech normal, gait normal, strength normal in all extremities.  DTRs symmetric  Face motor normal, no rigidity No tremor on today's exam Psych-- Cognition and judgment appear intact. Cooperative with normal attention span and concentration. No anxious or depressed appearing.     Assessment & Plan:    Today , I spent more than  25 min with the patient, >50% of the time counseling, had multiple questions about thyroid dz and tremors; also  reviewing the chart and labs ordered by other providers

## 2013-07-22 LAB — TSH: TSH: 2.72 u[IU]/mL (ref 0.35–5.50)

## 2013-11-10 ENCOUNTER — Encounter: Payer: Self-pay | Admitting: Internal Medicine

## 2013-11-10 ENCOUNTER — Ambulatory Visit (INDEPENDENT_AMBULATORY_CARE_PROVIDER_SITE_OTHER): Payer: BC Managed Care – PPO | Admitting: Internal Medicine

## 2013-11-10 VITALS — BP 98/70 | HR 63 | Temp 98.6°F | Wt 115.2 lb

## 2013-11-10 DIAGNOSIS — R946 Abnormal results of thyroid function studies: Secondary | ICD-10-CM

## 2013-11-10 DIAGNOSIS — Z0000 Encounter for general adult medical examination without abnormal findings: Secondary | ICD-10-CM | POA: Insufficient documentation

## 2013-11-10 DIAGNOSIS — M810 Age-related osteoporosis without current pathological fracture: Secondary | ICD-10-CM

## 2013-11-10 LAB — TSH: TSH: 1.98 u[IU]/mL (ref 0.35–4.50)

## 2013-11-10 NOTE — Progress Notes (Signed)
Subjective:    Patient ID: Candice Brennan, female    DOB: 01-09-1956, 58 y.o.   MRN: [id number]  DOS:  11/10/2013 Type of visit - description: ROV History: Hypothyroidism, good medication compliance. Osteopenia, chart reviewed, see assessment and plan   ROS In general feeling well, denies chest pain or difficulty breathing. No anxiety- depression   Past Medical History  Diagnosis Date  . Osteopenia     -2.4 @ L fem neck  . OSTEOPOROSIS 12/22/2007    Qualifier: Diagnosis of  By: Linna Darner MD, Gwyndolyn Saxon  10/05/2011 : T score - 2.4 L femoral neck BMD @ [place] Fosamax initiated 08/ 2009 ; stopped 05/2012 Family history of osteoporosis in her mother and 2 maternal aunts.    . Tremor 07/21/2013    Past Surgical History  Procedure Laterality Date  . Endometrial ablation      laparoscopic  . Colonoscopy with polypectomy  2009    Dr Ardis Hughs     History   Social History  . Marital Status: Married    Spouse Name: N/A    Number of Children: 0  . Years of Education: N/A   Occupational History  . retired -- Pharmacist, community     Social History Main Topics  . Smoking status: Former Smoker    Quit date: 04/24/1983  . Smokeless tobacco: Never Used     Comment: smoked [place], up to 1 ppd  . Alcohol Use: Yes     Comment:  occasionally  . Drug Use: Not on file  . Sexual Activity: Not on file   Other Topics Concern  . Not on file   Social History Narrative   Has 1 step daughter         Medication List       This list is accurate as of: 11/10/13  6:04 PM.  Always use your most recent med list.               Calcium Carbonate-Vit D-Min 600-400 MG-UNIT Tabs  Take 2 tablets by mouth daily.     cholecalciferol 1000 UNITS tablet  Commonly known as:  VITAMIN D  Take 1,000 Units by mouth daily.     Cinnamon 500 MG Tabs  Take 1 tablet by mouth 2 (two) times daily.     levothyroxine 25 MCG tablet  Commonly known as:  SYNTHROID, LEVOTHROID  TAKE ONE TABLET BY  MOUTH ONCE DAILY, Tuesday-Thursday takes 1.5 tablets     LUTEIN-ZEAXANTHIN PO  Take 1 tablet by mouth daily.     multivitamin tablet  Take 1 tablet by mouth daily.     NON FORMULARY  Take 2 tablets by mouth every morning. Omega Q Plus     NON FORMULARY  Co-Q10 100mg  Take 2 tsp in the morning.     OSTEO BI-FLEX TRIPLE STRENGTH PO  Take 1 tablet by mouth daily.     Turmeric Curcumin 500 MG Caps  Take 1 capsule by mouth daily.           Objective:   Physical Exam BP 98/70  Pulse 63  Temp(Src) 98.6 F (37 C) (Oral)  Wt 115 lb 3.2 oz (52.254 kg)  SpO2 97% General -- alert, well-developed, NAD.  Neck --no thyromegaly  Lungs -- normal respiratory effort, no intercostal retractions, no accessory muscle use, and normal breath sounds.  Heart-- normal rate, regular rhythm, no murmur.  Extremities-- no pretibial edema bilaterally  Neurologic--  alert & oriented X3. Speech normal, gait appropriate  for age, strength symmetric and appropriate for age.   Psych-- Cognition and judgment appear intact. Cooperative with normal attention span and concentration. No anxious or depressed appearing.     Assessment & Plan:

## 2013-11-10 NOTE — Patient Instructions (Signed)
Get your blood work before you leave   Next visit is for a physical exam by September 2015, fasting Please make an appointment

## 2013-11-10 NOTE — Assessment & Plan Note (Signed)
Due for a physical in few months.

## 2013-11-10 NOTE — Assessment & Plan Note (Signed)
Good med compliance, check a TSH 

## 2013-11-10 NOTE — Assessment & Plan Note (Signed)
At some point had osteoporosis, Bone density test 2013 show a T score of -2.4, osteopenia. Status post Fosamax for 5 years. Patient will be reluctant to go back to Fosamax, she has some TMJ issues and she thinks is due to Fosamax.

## 2013-11-10 NOTE — Progress Notes (Signed)
Pre visit review using our clinic review tool, if applicable. No additional management support is needed unless otherwise documented below in the visit note. 

## 2013-11-12 ENCOUNTER — Telehealth: Payer: Self-pay | Admitting: Internal Medicine

## 2013-11-12 MED ORDER — LEVOTHYROXINE SODIUM 25 MCG PO TABS: ORAL_TABLET | ORAL | Status: DC

## 2013-11-12 NOTE — Telephone Encounter (Signed)
Caller name: Barri Relation to JE:HUDJSHF Call back number: [phone] Pharmacy:levothyroxine (SYNTHROID, LEVOTHROID) 25 MCG tablet   Reason for call: To request a refill for levothyroxine (SYNTHROID, LEVOTHROID) 25 MCG tablet

## 2013-11-12 NOTE — Telephone Encounter (Signed)
Med refilled.

## 2013-12-31 ENCOUNTER — Encounter: Payer: BC Managed Care – PPO | Admitting: Internal Medicine

## 2014-01-05 ENCOUNTER — Ambulatory Visit (INDEPENDENT_AMBULATORY_CARE_PROVIDER_SITE_OTHER): Payer: BC Managed Care – PPO | Admitting: Internal Medicine

## 2014-01-05 ENCOUNTER — Encounter: Payer: Self-pay | Admitting: Internal Medicine

## 2014-01-05 VITALS — BP 112/68 | HR 65 | Temp 98.5°F | Ht 64.0 in | Wt 115.5 lb

## 2014-01-05 DIAGNOSIS — Z23 Encounter for immunization: Secondary | ICD-10-CM

## 2014-01-05 DIAGNOSIS — Z0000 Encounter for general adult medical examination without abnormal findings: Secondary | ICD-10-CM

## 2014-01-05 DIAGNOSIS — M810 Age-related osteoporosis without current pathological fracture: Secondary | ICD-10-CM

## 2014-01-05 LAB — LIPID PANEL
CHOLESTEROL: 216 mg/dL — AB (ref 0–200)
HDL: 92.7 mg/dL (ref >39.00)
LDL Cholesterol: 116 mg/dL — ABNORMAL HIGH (ref 0–99)
NonHDL: 123.3
Total CHOL/HDL Ratio: 2
Triglycerides: 36 mg/dL (ref 0.0–149.0)
VLDL: 7.2 mg/dL (ref 0.0–40.0)

## 2014-01-05 LAB — COMPREHENSIVE METABOLIC PANEL
ALT: 17 U/L (ref 0–35)
AST: 24 U/L (ref 0–37)
Albumin: 4.2 g/dL (ref 3.5–5.2)
Alkaline Phosphatase: 68 U/L (ref 39–117)
BUN: 14 mg/dL (ref 6–23)
CHLORIDE: 101 meq/L (ref 96–112)
CO2: 29 mEq/L (ref 19–32)
Calcium: 9.3 mg/dL (ref 8.4–10.5)
Creatinine, Ser: 0.8 mg/dL (ref 0.4–1.2)
GFR: 76.19 mL/min (ref >60.00)
Glucose, Bld: 81 mg/dL (ref 70–99)
Potassium: 3.8 mEq/L (ref 3.5–5.1)
Sodium: 139 mEq/L (ref 135–145)
Total Bilirubin: 0.8 mg/dL (ref 0.2–1.2)
Total Protein: 6.3 g/dL (ref 6.0–8.3)

## 2014-01-05 NOTE — Patient Instructions (Signed)
Get your blood work before you leave      Please come back to the office in 6 months for a routine office (thyroid), no fasting    Stop by the front desk and schedule the visit

## 2014-01-05 NOTE — Progress Notes (Signed)
Subjective:    Patient ID: Candice Brennan, female    DOB: 06/05/1955, 58 y.o.   MRN: [id number]  DOS:  01/05/2014 Type of visit - description : CPX, here w/ her husband Interval history: feeling well  ROS   No  CP, SOB Denies  nausea, vomiting diarrhea, blood in the stools (-) cough, sputum production (-) wheezing, chest congestion No dysuria, gross hematuria, difficulty urinating  No vaginal discharge, bleed. No anxiety, depression  No headaches . Denies dizziness     Past Medical History  Diagnosis Date  . OSTEOPOROSIS 12/22/2007  . Tremor 07/21/2013    Past Surgical History  Procedure Laterality Date  . Endometrial ablation      laparoscopic  . Colonoscopy with polypectomy  2009    Dr Ardis Hughs     History   Social History  . Marital Status: Married    Spouse Name: N/A    Number of Children: 0  . Years of Education: N/A   Occupational History  . retired -- Pharmacist, community     Social History Main Topics  . Smoking status: Former Smoker    Quit date: 04/24/1983  . Smokeless tobacco: Never Used     Comment: smoked [place], up to 1 ppd  . Alcohol Use: Yes     Comment:  occasionally  . Drug Use: Not on file  . Sexual Activity: Not on file   Other Topics Concern  . Not on file   Social History Narrative   Has 1 step daughter      Family History  Problem Relation Age of Onset  . Breast cancer Mother   . Colon cancer Maternal Aunt   . Heart disease Maternal Uncle     MI ? age  . Stroke Maternal Grandmother     in 68s  . Stroke Maternal Grandfather     in 52s  . Diabetes Neg Hx   . Osteoporosis Mother   . Osteoporosis Maternal Aunt      X 2       Medication List       This list is accurate as of: 01/05/14  5:36 PM.  Always use your most recent med list.               Calcium Carbonate-Vit D-Min 600-400 MG-UNIT Tabs  Take 2 tablets by mouth daily.     cholecalciferol 1000 UNITS tablet  Commonly known as:  VITAMIN D  Take  1,000 Units by mouth daily.     Cinnamon 500 MG Tabs  Take 1 tablet by mouth 2 (two) times daily.     levothyroxine 25 MCG tablet  Commonly known as:  SYNTHROID, LEVOTHROID  TAKE ONE TABLET BY MOUTH ONCE DAILY, Tuesday-Thursday takes 1.5 tablets     LUTEIN-ZEAXANTHIN PO  Take 1 tablet by mouth daily.     multivitamin tablet  Take 1 tablet by mouth daily.     NON FORMULARY  Take 2 tablets by mouth every morning. Omega Q Plus     NON FORMULARY  Co-Q10 100mg  Take 2 tsp in the morning.     OSTEO BI-FLEX TRIPLE STRENGTH PO  Take 1 tablet by mouth daily.     Turmeric Curcumin 500 MG Caps  Take 1 capsule by mouth daily.           Objective:   Physical Exam BP 112/68  Pulse 65  Temp(Src) 98.5 F (36.9 C) (Oral)  Ht 5\' 4"  (1.626 m)  Wt  115 lb 8 oz (52.39 kg)  BMI 19.82 kg/m2  SpO2 99%  General -- alert, well-developed, NAD.  Neck --no thyromegaly  HEENT-- Not pale.   Breast-- no dominant mass, skin and nipples normal to inspection on palpation, axillary areas without mass or lymphadenopathy Lungs -- normal respiratory effort, no intercostal retractions, no accessory muscle use, and normal breath sounds.  Heart-- normal rate, regular rhythm, no murmur.  Abdomen-- Not distended, good bowel sounds,soft, non-tender. Palpable Ao upper abd, no tender , no bruit Extremities-- no pretibial edema bilaterally  Neurologic--  alert & oriented X3. Speech normal, gait appropriate for age, strength symmetric and appropriate for age.  Psych-- Cognition and judgment appear intact. Cooperative with normal attention span and concentration. No anxious or depressed appearing.     Assessment & Plan:

## 2014-01-05 NOTE — Assessment & Plan Note (Signed)
See previous entry, recommend calcium, vitamin D, physical activity and recheck a bone density test next year

## 2014-01-05 NOTE — Progress Notes (Signed)
Pre visit review using our clinic review tool, if applicable. No additional management support is needed unless otherwise documented below in the visit note. 

## 2014-01-05 NOTE — Assessment & Plan Note (Signed)
Td 2010 Flu shot today Mom had breast ca dx age 57 Last MMG 10-14 neg Breast exam today (-) Last gyn visit 2012, h/o of many normal paps, last 2012. H/o vag atrophy, strongle desires no further paps, will reasses on RTC Cscope 2009, 1 polyp, was told 10 years  Discussed diet-exercise Palpable Ao-- observation

## 2014-01-06 LAB — CBC WITH DIFFERENTIAL/PLATELET
BASOS ABS: 0.1 10*3/uL (ref 0.0–0.1)
Basophils Relative: 1.3 % (ref 0.0–3.0)
Eosinophils Absolute: 0.1 10*3/uL (ref 0.0–0.7)
Eosinophils Relative: 1.7 % (ref 0.0–5.0)
HCT: 42.8 % (ref 36.0–46.0)
Hemoglobin: 14.3 g/dL (ref 12.0–15.0)
LYMPHS PCT: 31.5 % (ref 12.0–46.0)
Lymphs Abs: 1.8 10*3/uL (ref 0.7–4.0)
MCHC: 33.3 g/dL (ref 30.0–36.0)
MCV: 94.2 fl (ref 78.0–100.0)
MONOS PCT: 7.1 % (ref 3.0–12.0)
Monocytes Absolute: 0.4 10*3/uL (ref 0.1–1.0)
Neutro Abs: 3.3 10*3/uL (ref 1.4–7.7)
Neutrophils Relative %: 58.4 % (ref 43.0–77.0)
Platelets: 211 10*3/uL (ref 150.0–400.0)
RBC: 4.55 Mil/uL (ref 3.87–5.11)
RDW: 14.2 % (ref 11.5–15.5)
WBC: 5.6 10*3/uL (ref 4.0–10.5)

## 2014-01-26 LAB — HM MAMMOGRAPHY: HM MAMMO: NORMAL

## 2014-03-26 ENCOUNTER — Other Ambulatory Visit: Payer: Self-pay

## 2014-07-07 ENCOUNTER — Other Ambulatory Visit: Payer: Self-pay

## 2014-07-07 MED ORDER — LEVOTHYROXINE SODIUM 25 MCG PO TABS: ORAL_TABLET | ORAL | Status: DC

## 2014-07-13 ENCOUNTER — Encounter: Payer: Self-pay | Admitting: Internal Medicine

## 2014-07-13 ENCOUNTER — Ambulatory Visit (INDEPENDENT_AMBULATORY_CARE_PROVIDER_SITE_OTHER): Payer: BLUE CROSS/BLUE SHIELD | Admitting: Internal Medicine

## 2014-07-13 VITALS — BP 122/64 | HR 72 | Temp 98.0°F | Ht 64.0 in | Wt 115.4 lb

## 2014-07-13 DIAGNOSIS — M719 Bursopathy, unspecified: Secondary | ICD-10-CM

## 2014-07-13 DIAGNOSIS — M791 Myalgia: Secondary | ICD-10-CM | POA: Diagnosis not present

## 2014-07-13 DIAGNOSIS — M7918 Myalgia, other site: Secondary | ICD-10-CM

## 2014-07-13 MED ORDER — CYCLOBENZAPRINE HCL 10 MG PO TABS: 10.0000 mg | ORAL_TABLET | Freq: Every evening | ORAL | Status: DC | PRN

## 2014-07-13 MED ORDER — PREDNISONE 10 MG PO TABS: ORAL_TABLET | ORAL | Status: DC

## 2014-07-13 NOTE — Progress Notes (Signed)
Pre visit review using our clinic review tool, if applicable. No additional management support is needed unless otherwise documented below in the visit note. 

## 2014-07-13 NOTE — Patient Instructions (Signed)
Take prednisone as prescribed Flexeril at nighttime, this is a muscle relaxant, will cause drowsiness Tylenol  500 mg OTC 2 tabs a day every 8 hours as needed for pain   Call if not gradually better  Next visit in 6 months for a physical exam

## 2014-07-13 NOTE — Progress Notes (Signed)
Subjective:    Patient ID: Candice Brennan, female    DOB: 1956-02-04, 59 y.o.   MRN: [id number]  DOS:  07/13/2014 Type of visit - description : ROV Interval history:  C/o pain the R trochanteric area x 1 year, already better, used to hurt to touch Also pain at the R gluteal area x few months, pain is steady-mild, no radiation, worse going upstairs, not worse going downstairs Taking Tylenol sometimes.   Review of Systems Denies fever chills No rash No bladder or bowel incontinence Still able to exercise. Occasionally has a ill-defined tingling/numbness at the right leg. No injury perse.   Past Medical History  Diagnosis Date  . OSTEOPOROSIS 12/22/2007  . Tremor 07/21/2013    Past Surgical History  Procedure Laterality Date  . Endometrial ablation      laparoscopic  . Colonoscopy with polypectomy  2009    Dr Ardis Hughs     History   Social History  . Marital Status: Married    Spouse Name: N/A  . Number of Children: 0  . Years of Education: N/A   Occupational History  . retired -- Pharmacist, community     Social History Main Topics  . Smoking status: Former Smoker    Quit date: 04/24/1983  . Smokeless tobacco: Never Used     Comment: smoked [place], up to 1 ppd  . Alcohol Use: Yes     Comment:  occasionally  . Drug Use: Not on file  . Sexual Activity: Not on file   Other Topics Concern  . Not on file   Social History Narrative   Has 1 step daughter         Medication List       This list is accurate as of: 07/13/14 11:59 PM.  Always use your most recent med list.               Calcium Carbonate-Vit D-Min 600-400 MG-UNIT Tabs  Take 2 tablets by mouth daily.     cholecalciferol 1000 UNITS tablet  Commonly known as:  VITAMIN D  Take 1,000 Units by mouth daily.     Cinnamon 500 MG Tabs  Take 1 tablet by mouth 2 (two) times daily.     cyclobenzaprine 10 MG tablet  Commonly known as:  FLEXERIL  Take 1 tablet (10 mg total) by mouth at  bedtime as needed for muscle spasms.     levothyroxine 25 MCG tablet  Commonly known as:  SYNTHROID, LEVOTHROID  TAKE ONE TABLET BY MOUTH ONCE DAILY, Tuesday-Thursday takes 1.5 tablets     LUTEIN-ZEAXANTHIN PO  Take 1 tablet by mouth daily.     multivitamin tablet  Take 1 tablet by mouth daily.     NON FORMULARY  Take 2 tablets by mouth every morning. Omega Q Plus     NON FORMULARY  Co-Q10 100mg  Take 2 tsp in the morning.     OSTEO BI-FLEX TRIPLE STRENGTH PO  Take 1 tablet by mouth daily.     predniSONE 10 MG tablet  Commonly known as:  DELTASONE  3 tabs x 2 days, 2 tabs x 2 days, 1 tab x 2 days     Turmeric Curcumin 500 MG Caps  Take 1 capsule by mouth daily.     vitamin E 400 UNIT capsule  Take 400 Units by mouth 2 (two) times daily.           Objective:   Physical Exam BP 122/64 mmHg  Pulse  72  Temp(Src) 98 F (36.7 C) (Oral)  Ht 5\' 4"  (1.626 m)  Wt 115 lb 6 oz (52.334 kg)  BMI 19.79 kg/m2  SpO2 98%  General:   Well developed, well nourished . NAD.  HEENT:  Normocephalic . Face symmetric, atraumatic MSK: Palpation of the trochanteric bursa   showing mild pain on the left, normal on the right. Palpation of the sacroiliac area negative. Skin: Not pale. Not jaundice Neurologic:  alert & oriented X3.  Speech normal, gait appropriate for age and unassisted DTRs symmetric, pinprick examination of the lower extremity normal, straight leg test negative. Psych--  Cognition and judgment appear intact.  Cooperative with normal attention span and concentration.  Behavior appropriate. No anxious or depressed appearing.       Assessment & Plan:    Gluteal pain-- resolving right trochanteric bursitis. Symptoms as above, doubt radiculopathy although she has occasional numbness on the right leg. Neurological exam is normal. Gluteal pain likely musculoskeletal. Plan:  Since patient is likely muscle skeletal will try a round of prednisone, Tylenol, Flexeril. If  she fails conservative treatment, will call for further advice

## 2014-08-09 ENCOUNTER — Telehealth: Payer: Self-pay | Admitting: Internal Medicine

## 2014-08-09 MED ORDER — CYCLOBENZAPRINE HCL 10 MG PO TABS: 10.0000 mg | ORAL_TABLET | Freq: Every evening | ORAL | Status: DC | PRN

## 2014-08-09 NOTE — Telephone Encounter (Signed)
Rx sent to [facility] in [place].

## 2014-08-09 NOTE — Telephone Encounter (Signed)
Please advise 

## 2014-08-09 NOTE — Telephone Encounter (Signed)
Caller name: Camy Relation to pt: self Call back number: [phone] Pharmacy: [facility] in [place]  Reason for call:   Patient states that she is feeling better but pain has not completely gone away since last visit but is wanting to know if Dr. Larose Kells would refill flexeril. She states that she is going on vacation and will not be back until June and will proceed with testing/appointments when she gets back.

## 2014-08-09 NOTE — Telephone Encounter (Signed)
Okay to refill Flexeril No. 30, no RF

## 2014-10-26 ENCOUNTER — Telehealth: Payer: Self-pay | Admitting: Internal Medicine

## 2014-10-26 NOTE — Telephone Encounter (Signed)
FYI. Please advise.

## 2014-10-26 NOTE — Telephone Encounter (Signed)
I don't see that I use the diagnosis of fibromyalgia anywhere. Do you see that? Also, is okay to come once a year (chronic issues) next CPX by 12-2014.

## 2014-10-26 NOTE — Telephone Encounter (Signed)
Caller name: Neriah Relation to pt: Call back number: [phone] Pharmacy:  Reason for call:   Patient states that DOS 2014/07/19 was coded incorrectly. She states that her insurance is telling her that this was coded as her having fibromyalgia. Patient states that she does not have fibromyalgia and would like someone to look into this.   Patient is also wanting to know if she needs to come in every 6 months. She states that her insurance only covers once a year.

## 2014-10-27 NOTE — Telephone Encounter (Signed)
Thank you :)

## 2014-10-27 NOTE — Telephone Encounter (Signed)
No, I do not see Fibromyalgia listed, only gluteal pain and bursitis. However, I will forward to [place] to take a look at as well.

## 2014-10-27 NOTE — Telephone Encounter (Signed)
Reviewed billing for DOS 07/13/14, OV was coded for "myalgia" or pain in the muscle which the patient was experiencing. Spoke with patient and explained the DX and ensured her that Fibromyalgia is not listed in her chart. Patient voiced understanding and all her questions were answered.

## 2014-11-11 ENCOUNTER — Encounter: Payer: Self-pay | Admitting: Gastroenterology

## 2015-01-10 ENCOUNTER — Encounter: Payer: Self-pay | Admitting: Internal Medicine

## 2015-01-10 ENCOUNTER — Ambulatory Visit (INDEPENDENT_AMBULATORY_CARE_PROVIDER_SITE_OTHER): Payer: BLUE CROSS/BLUE SHIELD | Admitting: Internal Medicine

## 2015-01-10 VITALS — BP 122/72 | HR 61 | Temp 97.6°F | Ht 64.0 in | Wt 117.5 lb

## 2015-01-10 DIAGNOSIS — Z0000 Encounter for general adult medical examination without abnormal findings: Secondary | ICD-10-CM | POA: Diagnosis not present

## 2015-01-10 DIAGNOSIS — Z09 Encounter for follow-up examination after completed treatment for conditions other than malignant neoplasm: Secondary | ICD-10-CM

## 2015-01-10 DIAGNOSIS — Z114 Encounter for screening for human immunodeficiency virus [HIV]: Secondary | ICD-10-CM

## 2015-01-10 DIAGNOSIS — M810 Age-related osteoporosis without current pathological fracture: Secondary | ICD-10-CM

## 2015-01-10 DIAGNOSIS — Z23 Encounter for immunization: Secondary | ICD-10-CM

## 2015-01-10 DIAGNOSIS — R0989 Other specified symptoms and signs involving the circulatory and respiratory systems: Secondary | ICD-10-CM

## 2015-01-10 DIAGNOSIS — I8390 Asymptomatic varicose veins of unspecified lower extremity: Secondary | ICD-10-CM

## 2015-01-10 DIAGNOSIS — Z1159 Encounter for screening for other viral diseases: Secondary | ICD-10-CM

## 2015-01-10 LAB — COMPREHENSIVE METABOLIC PANEL
ALK PHOS: 69 U/L (ref 39–117)
ALT: 20 U/L (ref 0–35)
AST: 27 U/L (ref 0–37)
Albumin: 4.3 g/dL (ref 3.5–5.2)
BILIRUBIN TOTAL: 0.6 mg/dL (ref 0.2–1.2)
BUN: 15 mg/dL (ref 6–23)
CO2: 31 meq/L (ref 19–32)
Calcium: 9.4 mg/dL (ref 8.4–10.5)
Chloride: 105 mEq/L (ref 96–112)
Creatinine, Ser: 0.77 mg/dL (ref 0.40–1.20)
GFR: 81.64 mL/min (ref >60.00)
Glucose, Bld: 86 mg/dL (ref 70–99)
Potassium: 4.8 mEq/L (ref 3.5–5.1)
Sodium: 143 mEq/L (ref 135–145)
TOTAL PROTEIN: 7 g/dL (ref 6.0–8.3)

## 2015-01-10 LAB — LIPID PANEL
CHOL/HDL RATIO: 2
Cholesterol: 198 mg/dL (ref 0–200)
HDL: 91.3 mg/dL (ref >39.00)
LDL Cholesterol: 99 mg/dL (ref 0–99)
NONHDL: 106.69
Triglycerides: 39 mg/dL (ref 0.0–149.0)
VLDL: 7.8 mg/dL (ref 0.0–40.0)

## 2015-01-10 LAB — TSH: TSH: 5.38 u[IU]/mL — ABNORMAL HIGH (ref 0.35–4.50)

## 2015-01-10 NOTE — Progress Notes (Signed)
Subjective:    Patient ID: Candice Brennan, female    DOB: [birth year], 59 y.o.   MRN: [id number]  DOS:  01/10/2015 Type of visit - description : CPX Concern about painful varicose veins    Review of Systems  Constitutional: No fever. No chills. No unexplained wt changes. No unusual sweats  HEENT: No dental problems, no ear discharge, no facial swelling, no voice changes. No eye discharge, no eye  redness , no  intolerance to light   Respiratory: No wheezing , no  difficulty breathing. No cough , no mucus production  Cardiovascular: No CP, no leg swelling , no  Palpitations  GI: no nausea, no vomiting, no diarrhea , no  abdominal pain.  No blood in the stools. No dysphagia, no odynophagia    Endocrine: No polyphagia, no polyuria , no polydipsia  GU: No dysuria, gross hematuria, difficulty urinating. No urinary urgency, no frequency.  Musculoskeletal: No joint swellings or unusual aches or pains  Skin: No change in the color of the skin, palor , no  Rash  Allergic, immunologic: No environmental allergies , no  food allergies  Neurological: No dizziness no  syncope. No headaches. No diplopia, no slurred, no slurred speech, no motor deficits, no facial  Numbness  Hematological: No enlarged lymph nodes, no easy bruising , no unusual bleedings  Psychiatry: No suicidal ideas, no hallucinations, no beavior problems, no confusion.  No unusual/severe anxiety, no depression   Past Medical History  Diagnosis Date  . OSTEOPOROSIS 12/22/2007  . Tremor 07/21/2013  . Hypothyroidism 10/10/2009    Qualifier: Diagnosis of  By: Linna Darner MD, Gwyndolyn Saxon   04/27/2009 TSH 7.66, free T4 0.86, free T3 2.5  01/02/13 Synthroid increased to 25 mcg one daily except 1-1/2 pills Tuesday and Thursday. Repeat TSH recommended 4-6 months after increase.     Past Surgical History  Procedure Laterality Date  . Endometrial ablation      laparoscopic  . Colonoscopy with polypectomy  2009    Dr Ardis Hughs      Social History   Social History  . Marital Status: Married    Spouse Name: N/A  . Number of Children: 0  . Years of Education: N/A   Occupational History  . retired -- Pharmacist, community     Social History Main Topics  . Smoking status: Former Smoker    Quit date: 04/24/1983  . Smokeless tobacco: Never Used     Comment: smoked [place], up to 1 ppd  . Alcohol Use: Yes     Comment:  occasionally  . Drug Use: Not on file  . Sexual Activity: Not on file   Other Topics Concern  . Not on file   Social History Narrative   Has 1 step daughter      Family History  Problem Relation Age of Onset  . Breast cancer Mother   . Colon cancer Maternal Aunt   . Heart disease Maternal Uncle     MI ? age  . Stroke Maternal Grandmother     in 66s  . Stroke Maternal Grandfather     in 18s  . Diabetes Neg Hx   . Osteoporosis Mother   . Osteoporosis Maternal Aunt      X 2       Medication List       This list is accurate as of: 01/10/15  6:20 PM.  Always use your most recent med list.  Calcium Carbonate-Vit D-Min 600-400 MG-UNIT Tabs  Take 2 tablets by mouth daily.     cholecalciferol 1000 UNITS tablet  Commonly known as:  VITAMIN D  Take 1,000 Units by mouth daily.     Cinnamon 500 MG Tabs  Take 1 tablet by mouth 2 (two) times daily.     levothyroxine 25 MCG tablet  Commonly known as:  SYNTHROID, LEVOTHROID  TAKE ONE TABLET BY MOUTH ONCE DAILY, Tuesday-Thursday takes 1.5 tablets     LUTEIN-ZEAXANTHIN PO  Take 1 tablet by mouth daily.     multivitamin tablet  Take 1 tablet by mouth daily.     NON FORMULARY  Take 2 tablets by mouth every morning. Omega Q Plus     NON FORMULARY  Co-Q10 100mg  Take 2 tsp in the morning.     OSTEO BI-FLEX TRIPLE STRENGTH PO  Take 1 tablet by mouth 2 (two) times daily. Osteo Bi-Flex     Turmeric Curcumin 500 MG Caps  Take 1 capsule by mouth daily.     vitamin E 400 UNIT capsule  Take 400 Units by mouth 2  (two) times daily.           Objective:   Physical Exam BP 122/72 mmHg  Pulse 61  Temp(Src) 97.6 F (36.4 C) (Oral)  Ht 5\' 4"  (1.626 m)  Wt 117 lb 8 oz (53.298 kg)  BMI 20.16 kg/m2  SpO2 98% General:   Well developed, well nourished . NAD.  HEENT:  Normocephalic . Face symmetric, atraumatic Neck: No thyromegaly, normal carotid pulses Lungs:  CTA B Normal respiratory effort, no intercostal retractions, no accessory muscle use. Heart: RRR,  no murmur.  no pretibial edema bilaterally  Abdomen:  Not distended, soft, non-tender. No rebound or rigidity. + Palpable abdominal aorta, nontender, bruit?  Skin: Not pale. Not jaundice Neurologic:  alert & oriented X3.  Speech normal, gait appropriate for age and unassisted Psych--  Cognition and judgment appear intact.  Cooperative with normal attention span and concentration.  Behavior appropriate. No anxious or depressed appearing.    Assessment & Plan:   Problem list > hypothyroidism Osteoporosis 2009, dexa 2013: T score  -2.4 s/p fosamax x 5 years Tremors  Dx  2015 Menopause +FH breast Ca (mother)  A/P Osteoporosis : on ca and vit d, due for DEXA Varicose veins: Has a history of varicose veins, the one on the posterior right calf is hurting to some extent, will refer to interventional radiology Hypothyroidism: Check a TSH today

## 2015-01-10 NOTE — Progress Notes (Signed)
Pre visit review using our clinic review tool, if applicable. No additional management support is needed unless otherwise documented below in the visit note. 

## 2015-01-10 NOTE — Assessment & Plan Note (Signed)
Osteoporosis : on ca and vit d, due for DEXA Varicose veins: Has a history of varicose veins, the one on the posterior right calf is hurting to some extent, will refer to interventional radiology Hypothyroidism: Check a TSH today

## 2015-01-10 NOTE — Patient Instructions (Signed)
Get your blood work before you leave   We will schedule a bone density test Schedule an  aorta ultrasound Referral to radiology   Next visit  for a  checkup in 6 months (15 minutes) Please schedule an appointment at the front desk No need to come back fasting

## 2015-01-10 NOTE — Assessment & Plan Note (Addendum)
Td 2010 ; Flu shot today Mom had breast ca dx age 58: Last MMG 2015 , next schedule for 01-28-15 (3D-MMG) Last gyn visit 2012, h/o of many normal paps, last 2012. I recommend to continue screening up until age 74 per guidelines, she declined, states is too painful d/t  vag atrophy. She is asymptomatic, encouraged to call me if she changes her mind   Cscope 2009, 1 polyp, was told 10 years  Discussed diet-exercise Palpable Ao-- recommend ultrasound CMP, FLP, TSH, HIV, hep C

## 2015-01-11 LAB — HEPATITIS C ANTIBODY: HCV Ab: NEGATIVE

## 2015-01-11 LAB — HIV ANTIBODY (ROUTINE TESTING W REFLEX): HIV: NONREACTIVE

## 2015-01-12 ENCOUNTER — Telehealth: Payer: Self-pay | Admitting: Internal Medicine

## 2015-01-12 MED ORDER — LEVOTHYROXINE SODIUM 25 MCG PO TABS: ORAL_TABLET | ORAL | Status: DC

## 2015-01-12 NOTE — Addendum Note (Signed)
Addended by: Wilfrid Lund on: 01/12/2015 01:36 PM   Modules accepted: Orders

## 2015-01-12 NOTE — Telephone Encounter (Signed)
Caller name: Candice Brennan   Relationship to patient: Self   Can be reached: [phone]  Pharmacy:  Reason for call: pt  Request call back to discuss your previous VM. She says that she will be out of town tomorrow but will be back Friday if you could call her back then. If not leave a detailed vm explaining if she need to make a different appt or what she should do about her results.

## 2015-01-12 NOTE — Telephone Encounter (Signed)
Pt needs to keep appt in March 2017, but return to office in 2 months for lab appt only to recheck her TSH. All inform was left on her home voicemail earlier today as well as released to MyChart if she has questions.

## 2015-01-14 NOTE — Telephone Encounter (Signed)
Patient scheduled labs only for 03/15/2015

## 2015-01-18 ENCOUNTER — Ambulatory Visit (HOSPITAL_BASED_OUTPATIENT_CLINIC_OR_DEPARTMENT_OTHER)
Admission: RE | Admit: 2015-01-18 | Discharge: 2015-01-18 | Disposition: A | Discharge status: Home / Self Care | Payer: BLUE CROSS/BLUE SHIELD | Source: Ambulatory Visit | Attending: Internal Medicine | Admitting: Internal Medicine

## 2015-01-18 DIAGNOSIS — I714 Abdominal aortic aneurysm, without rupture: Secondary | ICD-10-CM | POA: Insufficient documentation

## 2015-01-18 DIAGNOSIS — R0989 Other specified symptoms and signs involving the circulatory and respiratory systems: Secondary | ICD-10-CM | POA: Insufficient documentation

## 2015-01-28 LAB — HM MAMMOGRAPHY

## 2015-02-07 ENCOUNTER — Other Ambulatory Visit: Payer: Self-pay | Admitting: Internal Medicine

## 2015-02-07 ENCOUNTER — Other Ambulatory Visit: Payer: Self-pay

## 2015-02-07 DIAGNOSIS — I8390 Asymptomatic varicose veins of unspecified lower extremity: Secondary | ICD-10-CM

## 2015-02-08 ENCOUNTER — Telehealth: Payer: Self-pay | Admitting: Internal Medicine

## 2015-02-08 NOTE — Telephone Encounter (Signed)
Caller name: Shirlie Enck  Relationship to patient: Self   Can be reached:  [phone]    Reason for call: Pt called in because she is concerned that she was charged for a few labs that she feels were not required for her CPE . She says that she spoke with [facility] and was informed that the labs were not required, now pt is saying that her insurance will not pay for them and she wants to know why were they ordered. I sent this concern to [place] because of the billing concern (pt is disputing and plan to not pay) but she is also requesting a call back to discuss why such labs were performed and ordered by provider.     Please advise.     Thanks.

## 2015-02-08 NOTE — Telephone Encounter (Signed)
HIV screen and Hepatitis C screen recoded to CPE. Have let [place] know to try to get insurance rebilled. Will inform Pt.

## 2015-02-08 NOTE — Telephone Encounter (Signed)
Pt called in to discuss a CPE bill for labs. Sent message to Glass blower/designer to look into and call pt back for follow up.     Thanks.

## 2015-02-08 NOTE — Telephone Encounter (Signed)
Spoke with Pt, informed her that we are going to re-code and resubmit to insurance, informed her that most insurances have paid HIV screen every 5 years and Hep C once. Informed her I would call [facility] in the morning and inform them that we are resubmitting to insurance. Also informed Pt that CDC is recommending routine HIV screen and Hep C panel on "baby boomers." Pt verbalized understanding and thanked me for my help.

## 2015-02-08 NOTE — Telephone Encounter (Signed)
Error

## 2015-02-15 ENCOUNTER — Encounter: Payer: Self-pay | Admitting: Internal Medicine

## 2015-02-15 ENCOUNTER — Telehealth: Payer: Self-pay

## 2015-02-15 DIAGNOSIS — M810 Age-related osteoporosis without current pathological fracture: Secondary | ICD-10-CM

## 2015-02-15 NOTE — Telephone Encounter (Signed)
Advise patient, she has very mild osteopenia. These are very good results, continue calcium and vitamin D

## 2015-02-15 NOTE — Telephone Encounter (Signed)
Pt said she had bone density done 01/28/15 at [facility] the same day she had her mammogram. [facility] gave her the results for the mammogram but told her the bone density results had to come from the physician.

## 2015-02-15 NOTE — Telephone Encounter (Signed)
CMA clerical error, we did receive mammogram results, which were normal. Birads: 1, next mammogram in 1 year. Spoke with Madlyn Frankel at [facility] mammography regarding Bone Density. Informed Pt did have Bone Density completed on 01/28/2015, Janette informed me she would fax over results to [phone].

## 2015-02-15 NOTE — Telephone Encounter (Signed)
Bone density was never ordered on 01/10/2015 at CPE appt. Pt due for repeat bone density. Ordered to [facility] at [place].

## 2015-02-15 NOTE — Telephone Encounter (Deleted)
We have not received mammogram results or bone density results. More than likely she will have to call [facility] and/or go to their office to sign a release of information since the orders did not come from [place], per the chart.

## 2015-02-15 NOTE — Telephone Encounter (Signed)
-----   Message from [name] sent at 02/14/2015 10:23 AM EDT ----- Regarding: Results from Bone Density Scan Contact: [phone] Pt said that she had this a couple weeks ago and has not received results. She said she had mammogram at the same time but was given those results by the office already. Please call today at cell #.

## 2015-02-15 NOTE — Telephone Encounter (Signed)
Received Bone Density results, placed in Dr. Ethel Rana red folder for resulting. Will inform Pt of results as soon as received back from Dr. Larose Kells.

## 2015-02-16 NOTE — Telephone Encounter (Signed)
Pt notified and she voices understanding.

## 2015-02-25 ENCOUNTER — Encounter: Payer: Self-pay | Admitting: Internal Medicine

## 2015-03-15 ENCOUNTER — Other Ambulatory Visit (INDEPENDENT_AMBULATORY_CARE_PROVIDER_SITE_OTHER): Payer: BLUE CROSS/BLUE SHIELD

## 2015-03-15 DIAGNOSIS — Z0000 Encounter for general adult medical examination without abnormal findings: Secondary | ICD-10-CM | POA: Diagnosis not present

## 2015-03-15 LAB — TSH: TSH: 6.89 u[IU]/mL — ABNORMAL HIGH (ref 0.35–4.50)

## 2015-03-21 ENCOUNTER — Telehealth: Payer: Self-pay | Admitting: Internal Medicine

## 2015-03-21 MED ORDER — LEVOTHYROXINE SODIUM 50 MCG PO TABS: 50.0000 ug | ORAL_TABLET | Freq: Every day | ORAL | Status: DC

## 2015-03-21 NOTE — Addendum Note (Signed)
Addended by: Wilfrid Lund on: 03/21/2015 03:38 PM   Modules accepted: Orders

## 2015-03-21 NOTE — Addendum Note (Signed)
Addended by: Wilfrid Lund on: 03/21/2015 03:37 PM   Modules accepted: Orders, Medications

## 2015-03-21 NOTE — Telephone Encounter (Signed)
°  Relation to [id number] Call back [phone] Pharmacy:  Reason for call: pt states she spoke with Langley Gauss this morning and she stated she would get a message to you regarding her numbers for her thyroid test. States you had called her wanted to know the numbers. Pt states her numbers were high after her CPE in September, and then dr. Larose Kells increased her meds and she came back on 11/22 for more labs and now her numbers is higher at 6.89. Pt states she is taking .25 mcg 1 1/2 everyday except tues and Thursday pt just takes one.

## 2015-03-22 ENCOUNTER — Telehealth: Payer: Self-pay | Admitting: *Deleted

## 2015-03-22 NOTE — Telephone Encounter (Signed)
I don't know that vitamin D or kelp will help. If the TSH is elevated that means that she is under medicated. I suggest to take the Synthroid as recommended . Instead of just checking a TSH in 6 weeks, recommend she comes back for a office visit so we can discuss more.

## 2015-03-22 NOTE — Telephone Encounter (Signed)
Opened in error

## 2015-03-22 NOTE — Telephone Encounter (Signed)
Notes Recorded by Wilfrid Lund, CMA on 03/21/2015 at 3:38 PM TSH ordered. Notes Recorded by Wilfrid Lund, CMA on 03/21/2015 at 3:38 PM Results released to [place]. Synthroid 50 mcg sent to [facility]. Notes Recorded by Colon Branch, MD on 03/21/2015 at 1:10 PM She takes a total of 235.5 mcg a week Recommend to start Synthroid 50 mcg: 1 tablet every day #30, 3 RF TSH in 6 weeks, will readjust her medication at that time Notes Recorded by Wilfrid Lund, CMA on 03/21/2015 at 10:12 AM Paitent returning your call from last week about her Synthroid and TSH being to high. She takes .25mg  tabs. 1 and 1/2 tab everyday but Tuesday and Thursday. Tuesday and Thursday she takes 1 tab. Notes Recorded by Wilfrid Lund, CMA on 03/16/2015 at 8:20 AM [facility] informing Pt to return call regarding lab results. Results also released to MyChart asking how much Synthroid she is taking. Notes Recorded by Colon Branch, MD on 03/15/2015 at 4:57 PM Please call the patient: How much synthroid is she on?

## 2015-03-22 NOTE — Telephone Encounter (Signed)
Notified pt and she voices understanding. Changed lab appt to f/u with Dr Larose Kells at 11:15am on 05/03/15.

## 2015-03-22 NOTE — Telephone Encounter (Signed)
Spoke with pt re: below instruction. Pt concerned about why TSH continues to go higher. States she takes thyroid medication every day and takes first thing in the morning. Wants to know what else could be causing this? Also states her sister was diagnosed with hypothyroidism

## 2015-03-22 NOTE — Telephone Encounter (Signed)
Please see below message. Also states sister was diagnosed with same condition and she took vitamin D and kelp capsules and her thyroid has returned to normal. Wants to know if she could try this instead? Also wants to know if TSH is high why are we increasing her medication dose? Please advise?

## 2015-04-07 ENCOUNTER — Other Ambulatory Visit: Payer: Self-pay | Admitting: Internal Medicine

## 2015-04-27 ENCOUNTER — Telehealth: Payer: Self-pay

## 2015-04-27 NOTE — Telephone Encounter (Signed)
Due to provider's RIE next week patients will not be able to see him next week, Patient wants to still do labs that day and follow up following week for results, can we but in lab orders for patient to get labs on 10th and see Dr. Larose Kells on 16th please advise

## 2015-04-27 NOTE — Telephone Encounter (Deleted)
Dr. Larose Kells requesting note left this morning regarding labs be placed in new telephone note and resent due to not be in regards to previous notes in this encounter. Thank you.

## 2015-04-27 NOTE — Telephone Encounter (Signed)
Oneida Alar, NT at 04/27/2015 10:23 AM     Status: Signed       Expand All Collapse All   Due to provider's RIE next week patients will not be able to see him next week, Patient wants to still do labs that day and follow up following week for results, can we but in lab orders for patient to get labs on 10th and see Dr. Larose Kells on 16th please advise

## 2015-04-27 NOTE — Telephone Encounter (Signed)
Please advise 

## 2015-04-28 NOTE — Telephone Encounter (Signed)
Needs a TSH, DX hypothyroidism. Does not need to see me until March 2017, please arrange.

## 2015-04-28 NOTE — Telephone Encounter (Signed)
TSH already ordered. Please see below, notes from Dr. Larose Kells. Thank you.

## 2015-05-03 ENCOUNTER — Other Ambulatory Visit: Payer: BLUE CROSS/BLUE SHIELD

## 2015-05-03 ENCOUNTER — Other Ambulatory Visit (INDEPENDENT_AMBULATORY_CARE_PROVIDER_SITE_OTHER): Payer: BLUE CROSS/BLUE SHIELD

## 2015-05-03 DIAGNOSIS — Z0000 Encounter for general adult medical examination without abnormal findings: Secondary | ICD-10-CM

## 2015-05-03 LAB — TSH: TSH: 3.14 u[IU]/mL (ref 0.35–4.50)

## 2015-05-09 ENCOUNTER — Encounter (INDEPENDENT_AMBULATORY_CARE_PROVIDER_SITE_OTHER): Payer: BLUE CROSS/BLUE SHIELD | Admitting: Internal Medicine

## 2015-05-09 ENCOUNTER — Encounter: Payer: Self-pay | Admitting: Internal Medicine

## 2015-05-09 DIAGNOSIS — Z09 Encounter for follow-up examination after completed treatment for conditions other than malignant neoplasm: Secondary | ICD-10-CM

## 2015-05-09 NOTE — Progress Notes (Deleted)
Pre visit review using our clinic review tool, if applicable. No additional management support is needed unless otherwise documented below in the visit note. 

## 2015-05-10 NOTE — Progress Notes (Signed)
No need for an appointment, just labs. JP

## 2015-05-25 HISTORY — PX: OTHER SURGICAL HISTORY: SHX169

## 2015-07-04 ENCOUNTER — Encounter: Payer: Self-pay | Admitting: Internal Medicine

## 2015-07-04 ENCOUNTER — Ambulatory Visit (INDEPENDENT_AMBULATORY_CARE_PROVIDER_SITE_OTHER): Payer: BLUE CROSS/BLUE SHIELD | Admitting: Internal Medicine

## 2015-07-04 VITALS — BP 118/72 | HR 61 | Temp 97.7°F | Ht 64.0 in | Wt 117.5 lb

## 2015-07-04 DIAGNOSIS — Z09 Encounter for follow-up examination after completed treatment for conditions other than malignant neoplasm: Secondary | ICD-10-CM

## 2015-07-04 DIAGNOSIS — E039 Hypothyroidism, unspecified: Secondary | ICD-10-CM

## 2015-07-04 DIAGNOSIS — M810 Age-related osteoporosis without current pathological fracture: Secondary | ICD-10-CM

## 2015-07-04 LAB — TSH: TSH: 3.43 u[IU]/mL (ref 0.35–4.50)

## 2015-07-04 NOTE — Patient Instructions (Signed)
GO TO THE LAB : Get the blood work    GO TO THE FRONT DESK *Schedule a complete physical exam to be done in 6 months  fasting

## 2015-07-04 NOTE — Assessment & Plan Note (Signed)
Hypothyroidism: Continue Synthroid 50 mcg, check a TSH Osteoporosis: Last T score 01-2015  wasvery good at -1.1. Plan is to recheck a bone density test in 2-3 years Status post varicose vein procedure, recovering well RTC 6 months

## 2015-07-04 NOTE — Progress Notes (Signed)
Pre visit review using our clinic review tool, if applicable. No additional management support is needed unless otherwise documented below in the visit note. 

## 2015-07-04 NOTE — Progress Notes (Signed)
Subjective:    Patient ID: Candice Brennan, female    DOB: [birth year], 60 y.o.   MRN: [id number]  DOS:  07/04/2015 Type of visit - description : Routine office visit Interval history:  Hypothyroidism: Good compliance of medication, due for a TSH. varicose veins: Had a procedure done 2 weeks ago, recovering well.  Review of Systems No chest pain or difficulty breathing No nausea, vomiting, constipation  Past Medical History  Diagnosis Date  . OSTEOPOROSIS 12/22/2007  . Tremor 07/21/2013  . Hypothyroidism 10/10/2009    Qualifier: Diagnosis of  By: Linna Darner MD, Gwyndolyn Saxon   04/27/2009 TSH 7.66, free T4 0.86, free T3 2.5  01/02/13 Synthroid increased to 25 mcg one daily except 1-1/2 pills Tuesday and Thursday. Repeat TSH recommended 4-6 months after increase.     Past Surgical History  Procedure Laterality Date  . Endometrial ablation      laparoscopic  . Colonoscopy with polypectomy  2009    Dr Ardis Hughs   . Varicose vein treatment Right 05-2015    Social History   Social History  . Marital Status: Married    Spouse Name: N/A  . Number of Children: 0  . Years of Education: N/A   Occupational History  . retired -- Pharmacist, community     Social History Main Topics  . Smoking status: Former Smoker    Quit date: 04/24/1983  . Smokeless tobacco: Never Used     Comment: smoked [place], up to 1 ppd  . Alcohol Use: Yes     Comment:  occasionally  . Drug Use: Not on file  . Sexual Activity: Not on file   Other Topics Concern  . Not on file   Social History Narrative   Has 1 step daughter         Medication List       This list is accurate as of: 07/04/15  1:21 PM.  Always use your most recent med list.               Calcium Carbonate-Vit D-Min 600-400 MG-UNIT Tabs  Take 2 tablets by mouth daily.     cholecalciferol 1000 units tablet  Commonly known as:  VITAMIN D  Take 1,000 Units by mouth daily. Reported on 07/04/2015     levothyroxine 50 MCG tablet    Commonly known as:  SYNTHROID, LEVOTHROID  Take 1 tablet (50 mcg total) by mouth daily before breakfast.     LUTEIN-ZEAXANTHIN PO  Take 1 tablet by mouth daily.     multivitamin tablet  Take 1 tablet by mouth daily.     NON FORMULARY  Take 2 tablets by mouth every morning. Omega Q Plus     NON FORMULARY  Co-Q10 100mg  Take 2 tsp in the morning.     OSTEO BI-FLEX TRIPLE STRENGTH PO  Take 1 tablet by mouth 2 (two) times daily. Osteo Bi-Flex     Turmeric Curcumin 500 MG Caps  Take 1 capsule by mouth daily.     vitamin E 400 UNIT capsule  Take 400 Units by mouth 2 (two) times daily. Reported on 07/04/2015           Objective:   Physical Exam BP 118/72 mmHg  Pulse 61  Temp(Src) 97.7 F (36.5 C) (Oral)  Ht 5\' 4"  (1.626 m)  Wt 117 lb 8 oz (53.298 kg)  BMI 20.16 kg/m2  SpO2 97% General:   Well developed, well nourished . NAD.  HEENT:  Normocephalic . Face symmetric,  atraumatic Lungs:  CTA B Normal respiratory effort, no intercostal retractions, no accessory muscle use. Heart: RRR,  no murmur.  No pretibial edema bilaterally  Skin: Not pale. Not jaundice Neurologic:  alert & oriented X3.  Speech normal, gait appropriate for age and unassisted Psych--  Cognition and judgment appear intact.  Cooperative with normal attention span and concentration.  Behavior appropriate. No anxious or depressed appearing.      Assessment & Plan:   Assessment> hypothyroidism Osteoporosis dx ~2009 --dexa 2013: T score  -2.4 s/p fosamax x 5 years --T score 01-2015 -1.1 Tremors  Dx  2015 Menopause +FH breast Ca (mother)  PLAN: Hypothyroidism: Continue Synthroid 50 mcg, check a TSH Osteoporosis: Last T score 01-2015  wasvery good at -1.1. Plan is to recheck a bone density test in 2-3 years Status post varicose vein procedure, recovering well RTC 6 months

## 2015-08-01 ENCOUNTER — Other Ambulatory Visit: Payer: Self-pay | Admitting: Internal Medicine

## 2016-01-12 ENCOUNTER — Telehealth: Payer: Self-pay | Admitting: Emergency Medicine

## 2016-01-12 ENCOUNTER — Ambulatory Visit (INDEPENDENT_AMBULATORY_CARE_PROVIDER_SITE_OTHER): Payer: BLUE CROSS/BLUE SHIELD | Admitting: Internal Medicine

## 2016-01-12 ENCOUNTER — Encounter: Payer: Self-pay | Admitting: Internal Medicine

## 2016-01-12 VITALS — BP 108/64 | HR 64 | Temp 97.4°F | Resp 12 | Ht 64.0 in | Wt 116.2 lb

## 2016-01-12 DIAGNOSIS — E039 Hypothyroidism, unspecified: Secondary | ICD-10-CM

## 2016-01-12 DIAGNOSIS — Z23 Encounter for immunization: Secondary | ICD-10-CM | POA: Diagnosis not present

## 2016-01-12 DIAGNOSIS — R7989 Other specified abnormal findings of blood chemistry: Secondary | ICD-10-CM | POA: Diagnosis not present

## 2016-01-12 DIAGNOSIS — Z0000 Encounter for general adult medical examination without abnormal findings: Secondary | ICD-10-CM

## 2016-01-12 LAB — LIPID PANEL
CHOL/HDL RATIO: 2
CHOLESTEROL: 199 mg/dL (ref 0–200)
HDL: 99.2 mg/dL (ref >39.00)
LDL CALC: 90 mg/dL (ref 0–99)
NonHDL: 99.48
TRIGLYCERIDES: 45 mg/dL (ref 0.0–149.0)
VLDL: 9 mg/dL (ref 0.0–40.0)

## 2016-01-12 LAB — CBC WITH DIFFERENTIAL/PLATELET
BASOS ABS: 0 10*3/uL (ref 0.0–0.1)
Basophils Relative: 0.5 % (ref 0.0–3.0)
EOS PCT: 1.9 % (ref 0.0–5.0)
Eosinophils Absolute: 0.1 10*3/uL (ref 0.0–0.7)
HCT: 41.5 % (ref 36.0–46.0)
Hemoglobin: 14.1 g/dL (ref 12.0–15.0)
LYMPHS ABS: 2.1 10*3/uL (ref 0.7–4.0)
Lymphocytes Relative: 38.8 % (ref 12.0–46.0)
MCHC: 33.9 g/dL (ref 30.0–36.0)
MCV: 93 fl (ref 78.0–100.0)
MONO ABS: 0.6 10*3/uL (ref 0.1–1.0)
MONOS PCT: 10.5 % (ref 3.0–12.0)
NEUTROS ABS: 2.6 10*3/uL (ref 1.4–7.7)
NEUTROS PCT: 48.3 % (ref 43.0–77.0)
PLATELETS: 223 10*3/uL (ref 150.0–400.0)
RBC: 4.46 Mil/uL (ref 3.87–5.11)
RDW: 14 % (ref 11.5–15.5)
WBC: 5.4 10*3/uL (ref 4.0–10.5)

## 2016-01-12 LAB — VITAMIN D 25 HYDROXY (VIT D DEFICIENCY, FRACTURES): VITD: 116.97 ng/mL (ref 30.00–100.00)

## 2016-01-12 LAB — BASIC METABOLIC PANEL
BUN: 17 mg/dL (ref 6–23)
CALCIUM: 8.9 mg/dL (ref 8.4–10.5)
CO2: 32 meq/L (ref 19–32)
Chloride: 103 mEq/L (ref 96–112)
Creatinine, Ser: 0.93 mg/dL (ref 0.40–1.20)
GFR: 65.43 mL/min (ref >60.00)
GLUCOSE: 82 mg/dL (ref 70–99)
Potassium: 3.9 mEq/L (ref 3.5–5.1)
SODIUM: 140 meq/L (ref 135–145)

## 2016-01-12 LAB — VITAMIN B12: Vitamin B-12: >1500 pg/mL — ABNORMAL HIGH (ref 211–911)

## 2016-01-12 LAB — TSH: TSH: 4.87 u[IU]/mL — ABNORMAL HIGH (ref 0.35–4.50)

## 2016-01-12 NOTE — Patient Instructions (Signed)
GO TO THE LAB : Get the blood work     GO TO THE FRONT DESK Schedule your next appointment for a complete physical exam in one year

## 2016-01-12 NOTE — Assessment & Plan Note (Signed)
Hypothyroidism: Continue Synthroid, checking labs Osteoporosis: On calcium, vitamin D, next dexa ~2019 Concern about B12 levels, checking labs RTC one year

## 2016-01-12 NOTE — Progress Notes (Signed)
Pre visit review using our clinic review tool, if applicable. No additional management support is needed unless otherwise documented below in the visit note. 

## 2016-01-12 NOTE — Progress Notes (Signed)
Subjective:    Patient ID: Candice Brennan, female    DOB: 08/06/1955, 60 y.o.   MRN: [id number]  DOS:  01/12/2016 Type of visit - description : cpx Interval history: In general feeling well, no major concerns.   Review of Systems Constitutional: No fever. No chills. No unexplained wt changes. No unusual sweats  HEENT: No dental problems, no ear discharge, no facial swelling, no voice changes. No eye discharge, no eye  redness , no  intolerance to light   Respiratory: No wheezing , no  difficulty breathing. No cough , no mucus production  Cardiovascular: No CP, no leg swelling , no  Palpitations  GI: no nausea, no vomiting, no diarrhea , no  abdominal pain.  No blood in the stools. No dysphagia, no odynophagia    Endocrine: No polyphagia, no polyuria , no polydipsia  GU: No dysuria, gross hematuria, difficulty urinating. No urinary urgency, no frequency.  Musculoskeletal: No joint swellings or unusual aches or pains. Occasional joints pop but no actual swelling  Skin: No change in the color of the skin, palor , no  Rash  Allergic, immunologic: No environmental allergies , no  food allergies  Neurological: No dizziness no  syncope. No headaches. No diplopia, no slurred, no slurred speech, no motor deficits, no facial  Numbness  Hematological: No enlarged lymph nodes, no easy bruising , no unusual bleedings  Psychiatry: No suicidal ideas, no hallucinations, no beavior problems, no confusion.  No unusual/severe anxiety, no depression   Past Medical History:  Diagnosis Date  . Hypothyroidism 10/10/2009   Qualifier: Diagnosis of  By: Linna Darner MD, Gwyndolyn Saxon   04/27/2009 TSH 7.66, free T4 0.86, free T3 2.5  01/02/13 Synthroid increased to 25 mcg one daily except 1-1/2 pills Tuesday and Thursday. Repeat TSH recommended 4-6 months after increase.   . OSTEOPOROSIS 12/22/2007  . Tremor 07/21/2013    Past Surgical History:  Procedure Laterality Date  . colonoscopy with polypectomy   2009   Dr Ardis Hughs   . ENDOMETRIAL ABLATION     laparoscopic  . varicose vein treatment Right 05-2015    Social History   Social History  . Marital status: Married    Spouse name: N/A  . Number of children: 0  . Years of education: N/A   Occupational History  . retired -- Merchant [employer] officer   Social History Main Topics  . Smoking status: Former Smoker    Quit date: 04/24/1983  . Smokeless tobacco: Never Used     Comment: smoked [place], up to 1 ppd  . Alcohol use Yes     Comment:  occasionally  . Drug use: Unknown  . Sexual activity: Not on file   Other Topics Concern  . Not on file   Social History Narrative   Has 1 step daughter    Family History  Problem Relation Age of Onset  . Breast cancer Mother   . Osteoporosis Mother   . Colon cancer Maternal Aunt   . Heart disease Maternal Uncle     MI ? age  . Stroke Maternal Grandmother     in 70s  . Stroke Maternal Grandfather     in 77s  . Osteoporosis Maternal Aunt      X 2  . Diabetes Neg Hx         Medication List       Accurate as of 01/12/16  9:11 AM. Always use your most recent med list.  Calcium Carbonate-Vit D-Min 600-400 MG-UNIT Tabs Take 2 tablets by mouth daily.   cholecalciferol 1000 units tablet Commonly known as:  VITAMIN D Take 1,000 Units by mouth daily. Reported on 07/04/2015   levothyroxine 50 MCG tablet Commonly known as:  SYNTHROID, LEVOTHROID Take 1 tablet (50 mcg total) by mouth daily before breakfast.   LUTEIN-ZEAXANTHIN PO Take 1 tablet by mouth daily.   multivitamin tablet Take 1 tablet by mouth daily.   NON FORMULARY Take 2 capsules by mouth daily. Nitric Oxide Blood Boost   NON FORMULARY Take 2 tablets by mouth every morning. Omega Q Plus   NON FORMULARY Co-Q10 100mg  Take 2 tsp in the morning.   OSTEO BI-FLEX TRIPLE STRENGTH PO Take 1 tablet by mouth 2 (two) times daily. Osteo Bi-Flex   Turmeric Curcumin 500 MG Caps Take 2 capsules by  mouth daily.   vitamin E 400 UNIT capsule Take 400 Units by mouth 2 (two) times daily. Reported on 07/04/2015          Objective:   Physical Exam BP 108/64 (BP Location: Left Arm, Patient Position: Sitting, Cuff Size: Small)   Pulse 64   Temp 97.4 F (36.3 C) (Oral)   Resp 12   Ht 5\' 4"  (1.626 m)   Wt 116 lb 4 oz (52.7 kg)   SpO2 98%   BMI 19.95 kg/m   General:   Well developed, well nourished . NAD.  Neck: No  thyromegaly  HEENT:  Normocephalic . Face symmetric, atraumatic Lungs:  CTA B Normal respiratory effort, no intercostal retractions, no accessory muscle use. Heart: RRR,  no murmur.  No pretibial edema bilaterally  Abdomen:  Not distended, soft, non-tender. No rebound or rigidity.  Palpable nontender abdominal aorta without a bruit Skin: Exposed areas without rash. Not pale. Not jaundice Neurologic:  alert & oriented X3.  Speech normal, gait appropriate for age and unassisted Strength symmetric and appropriate for age.  Psych: Cognition and judgment appear intact.  Cooperative with normal attention span and concentration.  Behavior appropriate. No anxious or depressed appearing.    Assessment & Plan:   Assessment> hypothyroidism Osteoporosis dx ~2009 --dexa 2013: T score  -2.4 s/p fosamax x 5 years --T score 01-2015 -1.1 Tremors  Dx  2015 Menopause +FH breast Ca (mother)  PLAN: Hypothyroidism: Continue Synthroid, checking labs Osteoporosis: On calcium, vitamin D, next dexa ~2019 Concern about B12 levels, checking labs RTC one year

## 2016-01-12 NOTE — Assessment & Plan Note (Signed)
Td 2010 ; Flu shot today  FH breast ca, mother dx age 59: Last MMG 01-2015 (-) Last gyn visit 2012, h/o of many normal paps, last 2012. See previous entry, so far has decided not to go back to gynecology  Cscope 2009, 1 polyp, was told 10 years  Doing well with diet, exercise. Palpable Ao-- [place] (-) AAA 2016  Labs: BMP, FLP, CBC, TSH, vitamin D, B12

## 2016-01-12 NOTE — Telephone Encounter (Signed)
Gill from [facility] called a Critical Vit D of 117 on this pt

## 2016-01-13 NOTE — Telephone Encounter (Signed)
Noted, see lab comments

## 2016-01-16 MED ORDER — LEVOTHYROXINE SODIUM 75 MCG PO TABS: 75.0000 ug | ORAL_TABLET | Freq: Every day | ORAL | 3 refills | Status: DC

## 2016-01-16 NOTE — Addendum Note (Signed)
Addended byDamita Dunnings D on: 01/16/2016 09:44 AM   Modules accepted: Orders

## 2016-01-19 ENCOUNTER — Telehealth: Payer: Self-pay | Admitting: Internal Medicine

## 2016-01-19 DIAGNOSIS — E039 Hypothyroidism, unspecified: Secondary | ICD-10-CM

## 2016-01-19 DIAGNOSIS — R7989 Other specified abnormal findings of blood chemistry: Secondary | ICD-10-CM

## 2016-01-19 NOTE — Telephone Encounter (Signed)
Patient is calling about her TSH lab results. She said Dr. Larose Kells asked her to change her medication and she thinks it may be too much. She is also concerned about the medication change in Vit. D. Please advise  Patient Relation: Self Patient Phone: [phone]  Patient will not be available tomorrow so she would appreciate a call back today, if not the earliest to call back would be on Tuesday.

## 2016-01-19 NOTE — Telephone Encounter (Signed)
She is currently taking Synthroid 50 mcg the next available dose is 75, nothing in between. We will check her TSH in 6 weeks to be sure those is okay. If 75 mcg happens to be a high-dose, I don't anticipate any any problems in the short-term. As far as vitamin D, levels are simply high, she needs less supplements.

## 2016-01-19 NOTE — Telephone Encounter (Signed)
Please advise 

## 2016-01-23 DIAGNOSIS — I87391 Chronic venous hypertension (idiopathic) with other complications of right lower extremity: Secondary | ICD-10-CM | POA: Diagnosis not present

## 2016-01-23 NOTE — Telephone Encounter (Signed)
Pt unavailable until Tuesday, 10/3, will call to discuss below on Tuesday.

## 2016-01-25 NOTE — Telephone Encounter (Signed)
Spoke w/ Pt, informed her of recommendations. Pt verbalized understanding. Lab appt scheduled 03/12/2016.

## 2016-01-26 DIAGNOSIS — D3131 Benign neoplasm of right choroid: Secondary | ICD-10-CM | POA: Diagnosis not present

## 2016-01-26 DIAGNOSIS — H2513 Age-related nuclear cataract, bilateral: Secondary | ICD-10-CM | POA: Diagnosis not present

## 2016-01-30 DIAGNOSIS — Z1231 Encounter for screening mammogram for malignant neoplasm of breast: Secondary | ICD-10-CM | POA: Diagnosis not present

## 2016-01-30 DIAGNOSIS — Z803 Family history of malignant neoplasm of breast: Secondary | ICD-10-CM | POA: Diagnosis not present

## 2016-01-30 LAB — HM MAMMOGRAPHY

## 2016-02-06 ENCOUNTER — Encounter: Payer: Self-pay | Admitting: Internal Medicine

## 2016-03-05 DIAGNOSIS — I8311 Varicose veins of right lower extremity with inflammation: Secondary | ICD-10-CM | POA: Diagnosis not present

## 2016-03-08 DIAGNOSIS — I83811 Varicose veins of right lower extremities with pain: Secondary | ICD-10-CM | POA: Diagnosis not present

## 2016-03-12 ENCOUNTER — Other Ambulatory Visit (INDEPENDENT_AMBULATORY_CARE_PROVIDER_SITE_OTHER): Payer: BLUE CROSS/BLUE SHIELD

## 2016-03-12 DIAGNOSIS — E039 Hypothyroidism, unspecified: Secondary | ICD-10-CM | POA: Diagnosis not present

## 2016-03-12 DIAGNOSIS — R7989 Other specified abnormal findings of blood chemistry: Secondary | ICD-10-CM

## 2016-03-12 LAB — TSH: TSH: 1.13 u[IU]/mL (ref 0.35–4.50)

## 2016-03-14 LAB — VITAMIN D 1,25 DIHYDROXY
VITAMIN D 1, 25 (OH) TOTAL: 35 pg/mL (ref 18–72)
VITAMIN D3 1, 25 (OH): 35 pg/mL
Vitamin D2 1, 25 (OH)2: <8 pg/mL

## 2016-04-28 ENCOUNTER — Other Ambulatory Visit: Payer: Self-pay | Admitting: Internal Medicine

## 2016-05-28 DIAGNOSIS — D225 Melanocytic nevi of trunk: Secondary | ICD-10-CM | POA: Diagnosis not present

## 2016-05-28 DIAGNOSIS — D1801 Hemangioma of skin and subcutaneous tissue: Secondary | ICD-10-CM | POA: Diagnosis not present

## 2016-05-28 DIAGNOSIS — L814 Other melanin hyperpigmentation: Secondary | ICD-10-CM | POA: Diagnosis not present

## 2016-05-28 DIAGNOSIS — L821 Other seborrheic keratosis: Secondary | ICD-10-CM | POA: Diagnosis not present

## 2016-07-30 DIAGNOSIS — M79604 Pain in right leg: Secondary | ICD-10-CM | POA: Diagnosis not present

## 2017-01-15 ENCOUNTER — Encounter: Payer: Self-pay | Admitting: Internal Medicine

## 2017-01-15 ENCOUNTER — Ambulatory Visit (INDEPENDENT_AMBULATORY_CARE_PROVIDER_SITE_OTHER): Payer: BLUE CROSS/BLUE SHIELD | Admitting: Internal Medicine

## 2017-01-15 VITALS — BP 116/68 | HR 59 | Temp 97.9°F | Resp 14 | Ht 64.0 in | Wt 120.0 lb

## 2017-01-15 DIAGNOSIS — M810 Age-related osteoporosis without current pathological fracture: Secondary | ICD-10-CM

## 2017-01-15 DIAGNOSIS — Z23 Encounter for immunization: Secondary | ICD-10-CM | POA: Diagnosis not present

## 2017-01-15 DIAGNOSIS — Z0000 Encounter for general adult medical examination without abnormal findings: Secondary | ICD-10-CM

## 2017-01-15 DIAGNOSIS — E039 Hypothyroidism, unspecified: Secondary | ICD-10-CM

## 2017-01-15 LAB — CBC WITH DIFFERENTIAL/PLATELET
BASOS PCT: 0.4 % (ref 0.0–3.0)
Basophils Absolute: 0 10*3/uL (ref 0.0–0.1)
EOS ABS: 0.1 10*3/uL (ref 0.0–0.7)
EOS PCT: 1.9 % (ref 0.0–5.0)
HCT: 43.7 % (ref 36.0–46.0)
HEMOGLOBIN: 14.3 g/dL (ref 12.0–15.0)
LYMPHS ABS: 1.6 10*3/uL (ref 0.7–4.0)
Lymphocytes Relative: 30.4 % (ref 12.0–46.0)
MCHC: 32.8 g/dL (ref 30.0–36.0)
MCV: 96.8 fl (ref 78.0–100.0)
MONO ABS: 0.5 10*3/uL (ref 0.1–1.0)
Monocytes Relative: 10 % (ref 3.0–12.0)
NEUTROS ABS: 3 10*3/uL (ref 1.4–7.7)
NEUTROS PCT: 57.3 % (ref 43.0–77.0)
PLATELETS: 225 10*3/uL (ref 150.0–400.0)
RBC: 4.51 Mil/uL (ref 3.87–5.11)
RDW: 14.2 % (ref 11.5–15.5)
WBC: 5.2 10*3/uL (ref 4.0–10.5)

## 2017-01-15 LAB — COMPREHENSIVE METABOLIC PANEL
ALBUMIN: 4.5 g/dL (ref 3.5–5.2)
ALK PHOS: 60 U/L (ref 39–117)
ALT: 16 U/L (ref 0–35)
AST: 19 U/L (ref 0–37)
BUN: 13 mg/dL (ref 6–23)
CHLORIDE: 105 meq/L (ref 96–112)
CO2: 31 mEq/L (ref 19–32)
Calcium: 9.5 mg/dL (ref 8.4–10.5)
Creatinine, Ser: 0.8 mg/dL (ref 0.40–1.20)
GFR: 77.58 mL/min (ref >60.00)
Glucose, Bld: 91 mg/dL (ref 70–99)
POTASSIUM: 4.9 meq/L (ref 3.5–5.1)
SODIUM: 141 meq/L (ref 135–145)
TOTAL PROTEIN: 6.8 g/dL (ref 6.0–8.3)
Total Bilirubin: 0.7 mg/dL (ref 0.2–1.2)

## 2017-01-15 LAB — LIPID PANEL
CHOLESTEROL: 206 mg/dL — AB (ref 0–200)
HDL: 102.3 mg/dL (ref >39.00)
LDL CALC: 96 mg/dL (ref 0–99)
NonHDL: 104.06
TRIGLYCERIDES: 42 mg/dL (ref 0.0–149.0)
Total CHOL/HDL Ratio: 2
VLDL: 8.4 mg/dL (ref 0.0–40.0)

## 2017-01-15 LAB — TSH: TSH: 2.35 u[IU]/mL (ref 0.35–4.50)

## 2017-01-15 NOTE — Progress Notes (Signed)
Pre visit review using our clinic review tool, if applicable. No additional management support is needed unless otherwise documented below in the visit note. 

## 2017-01-15 NOTE — Patient Instructions (Signed)
GO TO THE LAB : Get the blood work     GO TO THE FRONT DESK Schedule your next appointment for a  physical exam in one year  

## 2017-01-15 NOTE — Progress Notes (Signed)
Subjective:    Patient ID: Candice Brennan, female    DOB: October 01, 1955, 61 y.o.   MRN: [id number]  DOS:  01/15/2017 Type of visit - description : cpx Interval history: In general feeling well. She takes care of her mother who is elderly and that creates some stress. Occasionally feels tired but despite that she  remains active. Still has tremors. Occasional insomnia but most nights she sleeps well.   Review of Systems  Other than above, a 14 point review of systems is negative     Past Medical History:  Diagnosis Date  . Hypothyroidism 10/10/2009   Qualifier: Diagnosis of  By: Linna Darner MD, Gwyndolyn Saxon   04/27/2009 TSH 7.66, free T4 0.86, free T3 2.5  01/02/13 Synthroid increased to 25 mcg one daily except 1-1/2 pills Tuesday and Thursday. Repeat TSH recommended 4-6 months after increase.   . OSTEOPOROSIS 12/22/2007  . Tremor 07/21/2013    Past Surgical History:  Procedure Laterality Date  . colonoscopy with polypectomy  2009   Dr Ardis Hughs   . ENDOMETRIAL ABLATION     laparoscopic  . varicose vein treatment Right 05-2015    Social History   Social History  . Marital status: Married    Spouse name: N/A  . Number of children: 0  . Years of education: N/A   Occupational History  . retired -- Pharmacist, community     Social History Main Topics  . Smoking status: Former Smoker    Quit date: 04/24/1983  . Smokeless tobacco: Never Used     Comment: smoked [place], up to 1 ppd  . Alcohol use Yes     Comment:  occasionally  . Drug use: Unknown  . Sexual activity: Not on file   Other Topics Concern  . Not on file   Social History Narrative   Has 1 step daughter      Family History  Problem Relation Age of Onset  . Breast cancer Mother   . Osteoporosis Mother   . Colon cancer Maternal Aunt   . Heart disease Maternal Uncle        MI ? age  . Stroke Maternal Grandmother        in 55s  . Stroke Maternal Grandfather        in 63s  . Osteoporosis Maternal Aunt     X 2  . Diabetes Neg Hx      Allergies as of 01/15/2017      Reactions   Penicillins    Angioedema as child Because of a history of documented adverse serious drug reaction;Medi Alert bracelet  is recommended   Fosamax [alendronate Sodium]    05/29/12 "jaw issues"      Medication List       Accurate as of 01/15/17 11:59 PM. Always use your most recent med list.          Calcium Carbonate-Vit D-Min 600-400 MG-UNIT Tabs Take 2 tablets by mouth daily.   cholecalciferol 1000 units tablet Commonly known as:  VITAMIN D Take 1,000 Units by mouth daily. Reported on 07/04/2015   levothyroxine 75 MCG tablet Commonly known as:  SYNTHROID, LEVOTHROID Take 1 tablet (75 mcg total) by mouth daily before breakfast.   LUTEIN-ZEAXANTHIN PO Take 1 tablet by mouth daily.   multivitamin tablet Take 1 tablet by mouth daily.   NON FORMULARY Take 2 tablets by mouth every morning. Omega Q Plus   NON FORMULARY Co-Q10 119m Take 2 tsp in the morning.  OSTEO BI-FLEX TRIPLE STRENGTH PO Take 1 tablet by mouth 2 (two) times daily. Osteo Bi-Flex            Discharge Care Instructions        Start     Ordered   01/15/17 0000  Comp Met (CMET)     01/15/17 0937   01/15/17 0000  Lipid panel     01/15/17 0937   01/15/17 0000  CBC w/Diff     01/15/17 0937   01/15/17 0000  TSH     01/15/17 0937   01/15/17 0000  Vitamin D 1,25 dihydroxy     01/15/17 0937   01/15/17 0000  Flu Vaccine QUAD 6+ mos PF IM (Fluarix Quad PF)     01/15/17 0937         Objective:   Physical Exam BP 116/68 (BP Location: Left Arm, Patient Position: Sitting, Cuff Size: Small)   Pulse (!) 59   Temp 97.9 F (36.6 C) (Oral)   Resp 14   Ht 5' 4"  (1.626 m)   Wt 120 lb (54.4 kg)   SpO2 99%   BMI 20.60 kg/m   General:   Well developed, well nourished . NAD.  Neck: No  thyromegaly  HEENT:  Normocephalic . Face symmetric, atraumatic Lungs:  CTA B Normal respiratory effort, no intercostal retractions, no  accessory muscle use. Heart: RRR,  no murmur.  No pretibial edema bilaterally  Abdomen:  Not distended, soft, non-tender. No rebound or rigidity. + palpable nontender aorta  Skin: Exposed areas without rash. Not pale. Not jaundice Neurologic:  alert & oriented X3.  Speech normal, gait appropriate for age and unassisted Strength symmetric and appropriate for age.  Psych: Cognition and judgment appear intact.  Cooperative with normal attention span and concentration.  Behavior appropriate. No anxious or depressed appearing.    Assessment & Plan:   Assessment  hypothyroidism Osteoporosis dx ~2009 --dexa 2013: T score  -2.4 s/p fosamax x 5 years --T score 01-2015 -1.1 Tremors  Dx  2015 Menopause +FH breast Ca (mother)  PLAN: Hypothyroidism: Continue Synthroid, check a TSH. May need to recheck a TSH in few months Osteoporosis: On calcium and vitamin D. Last year, vitamin D was elevated, supplements decreased to 2000 units daily. Subsequently vitamin D okay. Recheck labs. Tremors: not noticeable today, usually triggered by stress, more prominent at the head than hands  RTC one year

## 2017-01-15 NOTE — Assessment & Plan Note (Signed)
-  Td 2010; flu shot today -female care FH breast ca, mother dx age 60: Last MMG 01-2016 Last gyn visit 2012, h/o of many normal paps, last 2012. So far has decided not to go back to gynecology, no sx  -CCS: Cscope 2009, 1 polyp, was told 10 years -diet, exercise: she continues to do well -Palpable Ao-- [place] (-) AAA 2016  -labs : CMP, FLP, vitamin D, CBC, TSH

## 2017-01-16 NOTE — Assessment & Plan Note (Signed)
Hypothyroidism: Continue Synthroid, check a TSH. May need to recheck a TSH in few months Osteoporosis: On calcium and vitamin D. Last year, vitamin D was elevated, supplements decreased to 2000 units daily. Subsequently vitamin D okay. Recheck labs. Tremors: not noticeable today, usually triggered by stress, more prominent at the head than hands  RTC one year

## 2017-01-18 LAB — VITAMIN D 1,25 DIHYDROXY
VITAMIN D 1, 25 (OH) TOTAL: 50 pg/mL (ref 18–72)
VITAMIN D3 1, 25 (OH): 50 pg/mL

## 2017-01-24 ENCOUNTER — Other Ambulatory Visit: Payer: Self-pay | Admitting: Internal Medicine

## 2017-02-05 LAB — HM MAMMOGRAPHY

## 2017-02-11 ENCOUNTER — Encounter: Payer: Self-pay | Admitting: Internal Medicine

## 2017-03-28 DIAGNOSIS — H524 Presbyopia: Secondary | ICD-10-CM | POA: Diagnosis not present

## 2017-03-28 DIAGNOSIS — H2513 Age-related nuclear cataract, bilateral: Secondary | ICD-10-CM | POA: Diagnosis not present

## 2017-03-28 DIAGNOSIS — D3131 Benign neoplasm of right choroid: Secondary | ICD-10-CM | POA: Diagnosis not present

## 2017-06-10 DIAGNOSIS — I8311 Varicose veins of right lower extremity with inflammation: Secondary | ICD-10-CM | POA: Diagnosis not present

## 2017-06-10 DIAGNOSIS — I83811 Varicose veins of right lower extremities with pain: Secondary | ICD-10-CM | POA: Diagnosis not present

## 2017-07-16 ENCOUNTER — Encounter: Payer: Self-pay | Admitting: Gastroenterology

## 2017-07-22 DIAGNOSIS — C4491 Basal cell carcinoma of skin, unspecified: Secondary | ICD-10-CM

## 2017-07-22 HISTORY — DX: Basal cell carcinoma of skin, unspecified: C44.91

## 2017-07-23 DIAGNOSIS — C44712 Basal cell carcinoma of skin of right lower limb, including hip: Secondary | ICD-10-CM | POA: Diagnosis not present

## 2017-07-23 DIAGNOSIS — L821 Other seborrheic keratosis: Secondary | ICD-10-CM | POA: Diagnosis not present

## 2017-07-23 DIAGNOSIS — D225 Melanocytic nevi of trunk: Secondary | ICD-10-CM | POA: Diagnosis not present

## 2017-07-24 ENCOUNTER — Encounter: Payer: Self-pay | Admitting: Gastroenterology

## 2017-07-29 DIAGNOSIS — J305 Allergic rhinitis due to food: Secondary | ICD-10-CM | POA: Diagnosis not present

## 2017-08-19 ENCOUNTER — Telehealth: Payer: Self-pay | Admitting: *Deleted

## 2017-08-19 NOTE — Telephone Encounter (Signed)
Received Dermatopathology Report results from [facility]; forwarded to provider/SLS 04/29

## 2017-10-01 ENCOUNTER — Encounter: Payer: Self-pay | Admitting: Gastroenterology

## 2017-10-01 ENCOUNTER — Other Ambulatory Visit: Payer: Self-pay

## 2017-10-01 ENCOUNTER — Ambulatory Visit (AMBULATORY_SURGERY_CENTER): Payer: Self-pay

## 2017-10-01 VITALS — Ht 63.0 in | Wt 118.0 lb

## 2017-10-01 DIAGNOSIS — Z1211 Encounter for screening for malignant neoplasm of colon: Secondary | ICD-10-CM

## 2017-10-01 MED ORDER — PEG 3350-KCL-NA BICARB-NACL 420 G PO SOLR: 4000.0000 mL | Freq: Once | ORAL | 0 refills | Status: AC

## 2017-10-01 NOTE — Progress Notes (Signed)
Denies allergies to eggs or soy products. Denies complication of anesthesia or sedation. Denies use of weight loss medication. Denies use of O2.   Emmi instructions declined.  

## 2017-10-08 ENCOUNTER — Telehealth: Payer: Self-pay | Admitting: Gastroenterology

## 2017-10-08 NOTE — Telephone Encounter (Signed)
Pt called asking to speak with Amy. She stated that she was talking to her and was calling her again because Amy asked her to obtain some information.

## 2017-10-15 ENCOUNTER — Encounter: Payer: Self-pay | Admitting: Gastroenterology

## 2017-10-15 ENCOUNTER — Other Ambulatory Visit: Payer: Self-pay

## 2017-10-15 ENCOUNTER — Ambulatory Visit (AMBULATORY_SURGERY_CENTER): Payer: BLUE CROSS/BLUE SHIELD | Admitting: Gastroenterology

## 2017-10-15 VITALS — BP 108/61 | HR 64 | Temp 98.2°F | Resp 11 | Ht 63.0 in | Wt 118.0 lb

## 2017-10-15 DIAGNOSIS — K5150 Left sided colitis without complications: Secondary | ICD-10-CM | POA: Diagnosis not present

## 2017-10-15 DIAGNOSIS — K529 Noninfective gastroenteritis and colitis, unspecified: Secondary | ICD-10-CM

## 2017-10-15 DIAGNOSIS — Z1211 Encounter for screening for malignant neoplasm of colon: Secondary | ICD-10-CM

## 2017-10-15 DIAGNOSIS — K639 Disease of intestine, unspecified: Secondary | ICD-10-CM

## 2017-10-15 MED ORDER — SODIUM CHLORIDE 0.9 % IV SOLN: 500.0000 mL | Freq: Once | INTRAVENOUS | Status: DC

## 2017-10-15 NOTE — Op Note (Signed)
[facility] Patient Name: Candice Brennan Procedure Date: 10/15/2017 10:17 AM MRN: [id number] Endoscopist: Milus Banister , MD Age: 62 Referring MD:  Date of Birth: 06/09/1955 Gender: Female Account #: [id number] Procedure:                Colonoscopy Indications:              Screening for colorectal malignant neoplasm Medicines:                Monitored Anesthesia Care Procedure:                Pre-Anesthesia Assessment:                           - Prior to the procedure, a History and Physical                            was performed, and patient medications and                            allergies were reviewed. The patient's tolerance of                            previous anesthesia was also reviewed. The risks                            and benefits of the procedure and the sedation                            options and risks were discussed with the patient.                            All questions were answered, and informed consent                            was obtained. Prior Anticoagulants: The patient has                            taken no previous anticoagulant or antiplatelet                            agents. ASA Grade Assessment: II - A patient with                            mild systemic disease. After reviewing the risks                            and benefits, the patient was deemed in                            satisfactory condition to undergo the procedure.                           After obtaining informed consent, the colonoscope  was passed under direct vision. Throughout the                            procedure, the patient's blood pressure, pulse, and                            oxygen saturations were monitored continuously. The                            Colonoscope was introduced through the anus and                            advanced to the the terminal ileum. The colonoscopy                            was  performed without difficulty. The patient                            tolerated the procedure well. The quality of the                            bowel preparation was good. The terminal ileum,                            ileocecal valve, appendiceal orifice, and rectum                            were photographed. Scope In: 10:24:44 AM Scope Out: 10:38:15 AM Scope Withdrawal Time: 0 hours 10 minutes 3 seconds  Total Procedure Duration: 0 hours 13 minutes 31 seconds  Findings:                 There was mild, patchy inflammation throughout the                            colon, most confluent in the right side into the                            cecum. This was manifested as granular mucosa with                            multiple punctate erosions. Biospies were taken                            from cecum (jar 1), right colon (jar 2), left colon                            (jar 3).                           The terminal ileum was normal.                           The rectume was normal.  The exam was otherwise without abnormality on                            direct and retroflexion views. Complications:            No immediate complications. Estimated blood loss:                            None. Estimated Blood Loss:     Estimated blood loss: none. Impression:               - Patchy mild inflammation that is most prominent                            in the right colon. Normal terminal ileum and                            rectum. Biopsies taken from several locations. This                            may represent mild IBD (Crohn's colitis?).                           - The examination was otherwise normal on direct                            and retroflexion views. Recommendation:           - Patient has a contact number available for                            emergencies. The signs and symptoms of potential                            delayed complications were  discussed with the                            patient. Return to normal activities tomorrow.                            Written discharge instructions were provided to the                            patient.                           - Resume previous diet.                           - Continue present medications.                           - Await final pathology results.                           - Repeat colonoscopy in 10 years for screening  purposes. Milus Banister, MD 10/15/2017 10:43:44 AM This report has been signed electronically.

## 2017-10-15 NOTE — Progress Notes (Signed)
Pt's states no medical or surgical changes since previsit or office visit. 

## 2017-10-15 NOTE — Patient Instructions (Signed)
YOU HAD AN ENDOSCOPIC PROCEDURE TODAY AT THE [facility]:   Refer to the procedure report that was given to you for any specific questions about what was found during the examination.  If the procedure report does not answer your questions, please call your gastroenterologist to clarify.  If you requested that your care partner not be given the details of your procedure findings, then the procedure report has been included in a sealed envelope for you to review at your convenience later.  YOU SHOULD EXPECT: Some feelings of bloating in the abdomen. Passage of more gas than usual.  Walking can help get rid of the air that was put into your GI tract during the procedure and reduce the bloating. If you had a lower endoscopy (such as a colonoscopy or flexible sigmoidoscopy) you may notice spotting of blood in your stool or on the toilet paper. If you underwent a bowel prep for your procedure, you may not have a normal bowel movement for a few days.  Please Note:  You might notice some irritation and congestion in your nose or some drainage.  This is from the oxygen used during your procedure.  There is no need for concern and it should clear up in a day or so.  SYMPTOMS TO REPORT IMMEDIATELY:   Following lower endoscopy (colonoscopy or flexible sigmoidoscopy):  Excessive amounts of blood in the stool  Significant tenderness or worsening of abdominal pains  Swelling of the abdomen that is new, acute  Fever of 100F or higher  For urgent or emergent issues, a gastroenterologist can be reached at any hour by calling [phone].   DIET:  We do recommend a small meal at first, but then you may proceed to your regular diet.  Drink plenty of fluids but you should avoid alcoholic beverages for 24 hours.  ACTIVITY:  You should plan to take it easy for the rest of today and you should NOT DRIVE or use heavy machinery until tomorrow (because of the sedation medicines used during the test).     FOLLOW UP: Our staff will call the number listed on your records the next business day following your procedure to check on you and address any questions or concerns that you may have regarding the information given to you following your procedure. If we do not reach you, we will leave a message.  However, if you are feeling well and you are not experiencing any problems, there is no need to return our call.  We will assume that you have returned to your regular daily activities without incident.  If any biopsies were taken you will be contacted by phone or by letter within the next 1-3 weeks.  Please call us at [phone] if you have not heard about the biopsies in 3 weeks.    SIGNATURES/CONFIDENTIALITY: You and/or your care partner have signed paperwork which will be entered into your electronic medical record.  These signatures attest to the fact that that the information above on your After Visit Summary has been reviewed and is understood.  Full responsibility of the confidentiality of this discharge information lies with you and/or your care-partner. 

## 2017-10-15 NOTE — Progress Notes (Signed)
A and O x3. Report to RN. Tolerated MAC anesthesia well.

## 2017-10-15 NOTE — Progress Notes (Signed)
Called to room to assist during endoscopic procedure.  Patient ID and intended procedure confirmed with present staff. Received instructions for my participation in the procedure from the performing physician.  

## 2017-10-16 ENCOUNTER — Telehealth: Payer: Self-pay

## 2017-10-16 NOTE — Telephone Encounter (Signed)
  Follow up Call-  Call back number 10/15/2017  Post procedure Call Back phone  # [phone]  Permission to leave phone message Yes  Some recent data might be hidden     Patient questions:  Do you have a fever, pain , or abdominal swelling? No. Pain Score  0 *  Have you tolerated food without any problems? Yes.    Have you been able to return to your normal activities? Yes.    Do you have any questions about your discharge instructions: Diet   No. Medications  No. Follow up visit  No.  Do you have questions or concerns about your Care? No.  Actions: * If pain score is 4 or above: No action needed, pain <4.

## 2017-10-22 DIAGNOSIS — C44712 Basal cell carcinoma of skin of right lower limb, including hip: Secondary | ICD-10-CM | POA: Diagnosis not present

## 2017-11-25 DIAGNOSIS — Z85828 Personal history of other malignant neoplasm of skin: Secondary | ICD-10-CM | POA: Diagnosis not present

## 2017-11-25 DIAGNOSIS — L080 Pyoderma: Secondary | ICD-10-CM | POA: Diagnosis not present

## 2018-01-06 DIAGNOSIS — Z85828 Personal history of other malignant neoplasm of skin: Secondary | ICD-10-CM | POA: Diagnosis not present

## 2018-01-06 DIAGNOSIS — L905 Scar conditions and fibrosis of skin: Secondary | ICD-10-CM | POA: Diagnosis not present

## 2018-01-21 ENCOUNTER — Ambulatory Visit (INDEPENDENT_AMBULATORY_CARE_PROVIDER_SITE_OTHER): Payer: BLUE CROSS/BLUE SHIELD | Admitting: Internal Medicine

## 2018-01-21 ENCOUNTER — Encounter: Payer: Self-pay | Admitting: Internal Medicine

## 2018-01-21 VITALS — BP 116/64 | HR 64 | Temp 97.4°F | Resp 14 | Ht 63.0 in | Wt 114.2 lb

## 2018-01-21 DIAGNOSIS — Z23 Encounter for immunization: Secondary | ICD-10-CM | POA: Diagnosis not present

## 2018-01-21 DIAGNOSIS — Z0000 Encounter for general adult medical examination without abnormal findings: Secondary | ICD-10-CM

## 2018-01-21 DIAGNOSIS — N6459 Other signs and symptoms in breast: Secondary | ICD-10-CM | POA: Diagnosis not present

## 2018-01-21 DIAGNOSIS — R599 Enlarged lymph nodes, unspecified: Secondary | ICD-10-CM

## 2018-01-21 DIAGNOSIS — E039 Hypothyroidism, unspecified: Secondary | ICD-10-CM

## 2018-01-21 DIAGNOSIS — Z09 Encounter for follow-up examination after completed treatment for conditions other than malignant neoplasm: Secondary | ICD-10-CM

## 2018-01-21 LAB — LIPID PANEL
CHOLESTEROL: 206 mg/dL — AB (ref 0–200)
HDL: 97.9 mg/dL (ref >39.00)
LDL Cholesterol: 98 mg/dL (ref 0–99)
NONHDL: 107.86
Total CHOL/HDL Ratio: 2
Triglycerides: 47 mg/dL (ref 0.0–149.0)
VLDL: 9.4 mg/dL (ref 0.0–40.0)

## 2018-01-21 LAB — CBC WITH DIFFERENTIAL/PLATELET
BASOS PCT: 0.6 % (ref 0.0–3.0)
Basophils Absolute: 0 10*3/uL (ref 0.0–0.1)
EOS PCT: 2.1 % (ref 0.0–5.0)
Eosinophils Absolute: 0.1 10*3/uL (ref 0.0–0.7)
HEMATOCRIT: 43.9 % (ref 36.0–46.0)
HEMOGLOBIN: 14.7 g/dL (ref 12.0–15.0)
LYMPHS PCT: 29.6 % (ref 12.0–46.0)
Lymphs Abs: 1.6 10*3/uL (ref 0.7–4.0)
MCHC: 33.5 g/dL (ref 30.0–36.0)
MCV: 93.7 fl (ref 78.0–100.0)
MONO ABS: 0.5 10*3/uL (ref 0.1–1.0)
MONOS PCT: 10.3 % (ref 3.0–12.0)
Neutro Abs: 3 10*3/uL (ref 1.4–7.7)
Neutrophils Relative %: 57.4 % (ref 43.0–77.0)
Platelets: 217 10*3/uL (ref 150.0–400.0)
RBC: 4.69 Mil/uL (ref 3.87–5.11)
RDW: 14 % (ref 11.5–15.5)
WBC: 5.3 10*3/uL (ref 4.0–10.5)

## 2018-01-21 LAB — COMPREHENSIVE METABOLIC PANEL
ALBUMIN: 4.3 g/dL (ref 3.5–5.2)
ALK PHOS: 68 U/L (ref 39–117)
ALT: 15 U/L (ref 0–35)
AST: 18 U/L (ref 0–37)
BILIRUBIN TOTAL: 0.7 mg/dL (ref 0.2–1.2)
BUN: 13 mg/dL (ref 6–23)
CO2: 31 mEq/L (ref 19–32)
CREATININE: 0.84 mg/dL (ref 0.40–1.20)
Calcium: 9.6 mg/dL (ref 8.4–10.5)
Chloride: 102 mEq/L (ref 96–112)
GFR: 73.09 mL/min (ref >60.00)
GLUCOSE: 90 mg/dL (ref 70–99)
POTASSIUM: 4.3 meq/L (ref 3.5–5.1)
SODIUM: 140 meq/L (ref 135–145)
TOTAL PROTEIN: 6.6 g/dL (ref 6.0–8.3)

## 2018-01-21 LAB — TSH: TSH: 1.71 u[IU]/mL (ref 0.35–4.50)

## 2018-01-21 NOTE — Patient Instructions (Signed)
GO TO THE LAB : Get the blood work     GO TO THE FRONT DESK Schedule your next appointment for a   checkup in 6 weeks  Consider Shingrix

## 2018-01-21 NOTE — Assessment & Plan Note (Addendum)
-  Td 2010; flu shot today; shingrex discussed, declined for now - FH breast ca, mother dx age 61: Last MMG 01-2017.see physical exam, has a rm pit mass, MMG and [place] pending -Last gyn visit 2012, h/o of many normal paps, last 2012. So far has decided not to go back to gynecology, offered referral.   DECLINED , states "will never do that again" d/t dyscomfort -CCS: Cscope 2009, 1 polyp, was told 10 years; cscope 09/2017, next per GI  -Palpable Ao-- [place] (-) AAA 2016  -labs: cmp,flp,cbc,tsh -Lifestyle is very healthy

## 2018-01-21 NOTE — Progress Notes (Signed)
Subjective:    Patient ID: Candice Brennan, female    DOB: July 06, 1955, 62 y.o.   MRN: [id number]  DOS:  01/21/2018 Type of visit - description : cpx, here with her husband who stated throughout the visit Interval history: He continued to feel well and eat healthy.  She remains active.   Review of Systems About 2 months ago noted a lump at the right armpit, apparently developed suddenly, has been as large as 1 inch in diameter, never was TTP, red or hot. Since then, it has decreased in size. SBE normal.  Other than above, a 14 point review of systems is negative    Past Medical History:  Diagnosis Date  . Arthritis   . BCC (basal cell carcinoma of skin) 07/2017   R leg  . Hypothyroidism 10/10/2009   Qualifier: Diagnosis of  By: Linna Darner MD, Gwyndolyn Saxon   04/27/2009 TSH 7.66, free T4 0.86, free T3 2.5  01/02/13 Synthroid increased to 25 mcg one daily except 1-1/2 pills Tuesday and Thursday. Repeat TSH recommended 4-6 months after increase.   . OSTEOPOROSIS 12/22/2007  . Tremor 07/21/2013    Past Surgical History:  Procedure Laterality Date  . colonoscopy with polypectomy  2009   Dr Ardis Hughs   . ENDOMETRIAL ABLATION     laparoscopic  . varicose vein treatment Right 05-2015    Social History   Socioeconomic History  . Marital status: Married    Spouse name: Not on file  . Number of children: 0  . Years of education: Not on file  . Highest education level: Not on file  Occupational History  . Occupation: retired -- Pharmacist, community   Social Needs  . Financial resource strain: Not on file  . Food insecurity:    Worry: Not on file    Inability: Not on file  . Transportation needs:    Medical: Not on file    Non-medical: Not on file  Tobacco Use  . Smoking status: Former Smoker    Last attempt to quit: 04/24/1983    Years since quitting: 34.7  . Smokeless tobacco: Never Used  . Tobacco comment: smoked [place], up to 1 ppd  Substance and Sexual Activity  . Alcohol  use: Yes    Comment:  occasionally  . Drug use: Not on file  . Sexual activity: Not on file  Lifestyle  . Physical activity:    Days per week: Not on file    Minutes per session: Not on file  . Stress: Not on file  Relationships  . Social connections:    Talks on phone: Not on file    Gets together: Not on file    Attends religious service: Not on file    Active member of club or organization: Not on file    Attends meetings of clubs or organizations: Not on file    Relationship status: Not on file  . Intimate partner violence:    Fear of current or ex partner: Not on file    Emotionally abused: Not on file    Physically abused: Not on file    Forced sexual activity: Not on file  Other Topics Concern  . Not on file  Social History Narrative   Has 1 step daughter      Family History  Problem Relation Age of Onset  . Breast cancer Mother   . Osteoporosis Mother   . Colon cancer Maternal Aunt   . Heart disease Maternal Uncle  MI ? age  . Lung cancer Maternal Uncle   . Stroke Maternal Grandmother        in 70s  . Stroke Maternal Grandfather        in 69s  . Osteoporosis Maternal Aunt         X 2  . Diabetes Neg Hx   . Liver cancer Neg Hx   . Pancreatic cancer Neg Hx   . Rectal cancer Neg Hx   . Stomach cancer Neg Hx      Allergies as of 01/21/2018      Reactions   Penicillins    Angioedema as child Because of a history of documented adverse serious drug reaction;Medi Alert bracelet  is recommended   Fosamax [alendronate Sodium]    05/29/12 "jaw issues"      Medication List        Accurate as of 01/21/18 11:59 PM. Always use your most recent med list.          BIOTIN MAXIMUM STRENGTH 10 MG Tabs Generic drug:  Biotin Take 1 tablet by mouth at bedtime.   cholecalciferol 1000 units tablet Commonly known as:  VITAMIN D Take 1,000 Units by mouth daily. Reported on 07/04/2015   levothyroxine 75 MCG tablet Commonly known as:  SYNTHROID,  LEVOTHROID Take 1 tablet (75 mcg total) by mouth daily before breakfast.   LUTEIN-ZEAXANTHIN PO Take 1 tablet by mouth daily.   multivitamin tablet Take 1 tablet by mouth daily.   NON FORMULARY Take 2 tablets by mouth every morning. Omega Q Plus   NON FORMULARY Co-Q10 100mg  Take 2 tsp in the morning.   OMEGA-3 1450 PO Take 2 tablets by mouth daily.   OSTEO BI-FLEX TRIPLE STRENGTH PO Take 1 tablet by mouth 2 (two) times daily. Osteo Bi-Flex   OVER THE COUNTER MEDICATION Calcium with vitamin D3 1000 units daily.   OVER THE COUNTER MEDICATION Tumeric Curcumin 500 mg every morning.   OVER THE COUNTER MEDICATION Osteo- Bi- Flex, One tablet two times daily.          Objective:   Physical Exam  Musculoskeletal:       Arms:  BP 116/64 (BP Location: Left Arm, Patient Position: Sitting, Cuff Size: Small)   Pulse 64   Temp (!) 97.4 F (36.3 C) (Oral)   Resp 14   Ht 5\' 3"  (1.6 m)   Wt 114 lb 4 oz (51.8 kg)   SpO2 98%   BMI 20.24 kg/m  General: Well developed, NAD, see BMI.  Neck: No  thyromegaly.  No unusual lymph nodes, no supraclavicular masses. HEENT:  Normocephalic . Face symmetric, atraumatic Breast exam: Breasts are symmetric, skin normal, nipples normal, no dominant mass. See graphic, there is soft mass, 1 cm located at the proximal biceps and distal pectoralis muscle.  Does not seem to communicate with the breast tissue Lungs:  CTA B Normal respiratory effort, no intercostal retractions, no accessory muscle use. Heart: RRR,  no murmur.  No pretibial edema bilaterally  Abdomen:  Not distended, soft, non-tender. No rebound or rigidity.   Skin: Exposed areas without rash. Not pale. Not jaundice Neurologic:  alert & oriented X3.  Speech normal, gait appropriate for age and unassisted Strength symmetric and appropriate for age.  Psych: Cognition and judgment appear intact.  Cooperative with normal attention span and concentration.  Behavior  appropriate. No anxious or depressed appearing.     Assessment & Plan:   Assessment  hypothyroidism Osteoporosis dx ~2009 --dexa 2013:  T score  -2.4 s/p fosamax x 5 years --T score 01-2015 -1.1 Tremors  Dx  2015 Menopause +FH breast Ca (mother) [place] 07/2017, R leg  PLAN: Hypothyroidism: Checking labs Osteoporosis: Continue vitamin D, physical activity, consider DEXA next year Armpit mass: Noted 2 months ago, has decreased in size from 1 inch to 1 cm.  See physical exam.  Plan: She already has a scheduled mammogram, will add a diagnostic ultrasound.  Reevaluate in 6 weeks, consider surgical referral. RTC 6 weeks.

## 2018-01-21 NOTE — Progress Notes (Signed)
Pre visit review using our clinic review tool, if applicable. No additional management support is needed unless otherwise documented below in the visit note. 

## 2018-01-22 ENCOUNTER — Other Ambulatory Visit: Payer: Self-pay | Admitting: Internal Medicine

## 2018-01-22 NOTE — Assessment & Plan Note (Signed)
Hypothyroidism: Checking labs Osteoporosis: Continue vitamin D, physical activity, consider DEXA next year Armpit mass: Noted 2 months ago, has decreased in size from 1 inch to 1 cm.  See physical exam.  Plan: She already has a scheduled mammogram, will add a diagnostic ultrasound.  Reevaluate in 6 weeks, consider surgical referral. RTC 6 weeks.

## 2018-02-06 ENCOUNTER — Ambulatory Visit
Admission: RE | Admit: 2018-02-06 | Discharge: 2018-02-06 | Disposition: A | Discharge status: Home / Self Care | Payer: BLUE CROSS/BLUE SHIELD | Source: Ambulatory Visit | Attending: Internal Medicine | Admitting: Internal Medicine

## 2018-02-06 DIAGNOSIS — N6489 Other specified disorders of breast: Secondary | ICD-10-CM | POA: Diagnosis not present

## 2018-02-06 DIAGNOSIS — R922 Inconclusive mammogram: Secondary | ICD-10-CM | POA: Diagnosis not present

## 2018-02-10 ENCOUNTER — Telehealth: Payer: Self-pay | Admitting: Internal Medicine

## 2018-02-10 NOTE — Telephone Encounter (Signed)
LMOM informing Pt okay to cancel appt for 11/19- informed her that I would cancel on our end. Informed her to call if questions/concerns.

## 2018-02-10 NOTE — Telephone Encounter (Signed)
Please advise 

## 2018-02-10 NOTE — Telephone Encounter (Signed)
I liked to recheck the lump in the armpit but the ultrasound was negative. Okay to cancel the upcoming appointment and simply come back in 1 year for a physical exam.

## 2018-02-10 NOTE — Telephone Encounter (Signed)
Copied from [place] [id number]. Topic: General - Inquiry >> Feb 10, 2018 10:38 AM Candice Brennan, NT wrote: Reason for CRM: Patient called and would like to know is she should keep her follow up appoinmtment on  03/11/18 due to all of her test results, mammogram and also  her ultrasound  being negative , please call her at [phone] ok to leave a message

## 2018-03-11 ENCOUNTER — Ambulatory Visit: Payer: BLUE CROSS/BLUE SHIELD | Admitting: Internal Medicine

## 2018-03-27 ENCOUNTER — Telehealth: Payer: Self-pay

## 2018-03-27 NOTE — Telephone Encounter (Signed)
She reported a 32-month history of lump and she was concerned.  By the time I saw her, she had already schedule a mammogram which needs to be charged as preventive. I ordered ultrasound which needed to be diagnostic as there was a clinical concern. See summary of my comments below. I'm willing to write a letter with above in her support it that will help  "Armpit mass: Noted 2 months ago, has decreased in size from 1 inch to 1 cm.  See physical exam.  Plan: She already has a scheduled mammogram, will add a diagnostic ultrasound.  Reevaluate in 6 weeks, consider surgical referral. RTC 6 weeks"

## 2018-03-27 NOTE — Telephone Encounter (Signed)
Author phoned pt. to relay messages regarding the need to bill as diagnostic vs. preventative. Pt. states she has mammogram yearly due to family hsitory, and it is always billed as preventative. Pt. stated when she saw Dr. Larose Kells on 10/1, she mentioned that her pre-existing lump to R axilla (lipoma?) that she has had for years was decreasing in size, and Dr. Larose Kells then did a breast exam with her permission, but she did not know that would translate to a different imaging service, coding, and therefore bill. Pt. States, " I didn't even have any pain there. I went in for my physical, knowing that I would be getting my preventative yearly mammogram". Pt. refuses to pay the $819 she has been charged. Author reassured pt. that she would route to Dr. Larose Kells and administrator to get situation re-evaluated.

## 2018-03-27 NOTE — Telephone Encounter (Signed)
Copied from [place] [id number]. Topic: Complaint - Billing/Coding >> Mar 19, 2018  4:09 PM Selinda Flavin B, [place] wrote: DOS: 02/06/18 Details of complaint: Patient calling and states that Dr Larose Kells sent her for a mammogram and a breast ultrasound. Patient states that it needs to be put in as preventative, not diagnostic. Her insurance will cover these at 100%.  How would the patient like to see this issue resolved? Billed/coded correctly so her insurance will cover this [phone] bill she received.    Will automatically be routed to [facility] pool. >> Mar 23, 2018  5:50 AM Jerene Dilling H wrote: Please send request to the office. [facility] coding is unable to view/change billing of mammograms.  >> Mar 24, 2018  9:41 AM Damita Dunnings, CMA wrote: Unable to change- Pt was having breast pain- required by [facility] to be diagnostic.  >> Mar 27, 2018 10:29 AM Yvette Rack wrote: Pt stated that she received a bill for $819.83 for a mammogram and ultrasound because it was coded as diagnostic.  Pt disputes having breast pain. Pt stated this matter is time sensitive as she is being told that it will be sent to collections. Pt requests a call back. Cb# [phone]

## 2018-03-27 NOTE — Telephone Encounter (Signed)
Please advise on next steps.

## 2018-03-31 NOTE — Telephone Encounter (Signed)
Author phoned pt., no answer.  Author left detailed VM with message that because it was a relatively new finding of lump that was reported to Dr. Larose Kells (2 months), he needed to legally code the mammogram as diagnostic. Dr. Larose Kells told Pryor Curia he would be happy to speak to her in person if she would like to schedule an OV. Routed to administrator as Juluis Rainier.

## 2018-03-31 NOTE — Telephone Encounter (Signed)
Patient called to check on the status of the previous TE, the patient would like a return call from Dr Larose Kells on Wednesday to discuss in detail  , please call her at [phone]. She would is requesting that Dr Larose Kells review her chart before calling .

## 2018-03-31 NOTE — Telephone Encounter (Signed)
Please notify pt of my comments, does she need a letter? I don't handle billing issues

## 2018-04-02 NOTE — Telephone Encounter (Addendum)
Called and spoke to patient regarding billing concern.  Patient advised that she discovered a lump prior to scheduling her screening mammogram but wanted to keep it as a screening due to insurance coverage.  Patient came in for CPE and stated that she advised Dr. Larose Kells of lump.  Orders were placed for dx mamm and [place].  Patient stated that she didn't feel that was fair and she shouldn't owe $819 bill.  Explained to patient that if a pt is having issues (ie. Lumps, pain, discharge) that a screening mamm could not be performed. Advised pt I would call billing.  Billing called and asked if patient could get financial assistance.  Advised that the patient would have to call to request that.  Called the breast center to ask if they have screening questions.  Advised that they ask if patient is having any "issues" and if they are then patient cannot have screening mamm but must call and get PCP to change order.  Also advised that charges are reviewed.  Called pt back and advised her to call billing to request a for financial assistance form.  Patient thanked me for my helped and acknowledged understanding of the process.

## 2018-04-02 NOTE — Telephone Encounter (Signed)
Pt calling back requesting to speak with Engineer, building services. Pt states that she can accept that the ultrasound be filled as diagnostic, but the mammogram was scheduled before lump found so she feels that like that should be preventative.

## 2018-04-02 NOTE — Telephone Encounter (Signed)
Forwarding to [employer]

## 2018-04-30 DIAGNOSIS — H2513 Age-related nuclear cataract, bilateral: Secondary | ICD-10-CM | POA: Diagnosis not present

## 2018-04-30 DIAGNOSIS — D3131 Benign neoplasm of right choroid: Secondary | ICD-10-CM | POA: Diagnosis not present

## 2018-07-07 DIAGNOSIS — L905 Scar conditions and fibrosis of skin: Secondary | ICD-10-CM | POA: Diagnosis not present

## 2018-07-07 DIAGNOSIS — Z85828 Personal history of other malignant neoplasm of skin: Secondary | ICD-10-CM | POA: Diagnosis not present

## 2018-12-24 ENCOUNTER — Other Ambulatory Visit: Payer: Self-pay | Admitting: Internal Medicine

## 2018-12-24 DIAGNOSIS — Z1231 Encounter for screening mammogram for malignant neoplasm of breast: Secondary | ICD-10-CM

## 2019-01-15 ENCOUNTER — Other Ambulatory Visit: Payer: Self-pay

## 2019-01-15 ENCOUNTER — Ambulatory Visit (INDEPENDENT_AMBULATORY_CARE_PROVIDER_SITE_OTHER): Payer: BC Managed Care – PPO

## 2019-01-15 DIAGNOSIS — Z23 Encounter for immunization: Secondary | ICD-10-CM | POA: Diagnosis not present

## 2019-01-15 NOTE — Progress Notes (Signed)
Here for flu shot

## 2019-01-25 ENCOUNTER — Other Ambulatory Visit: Payer: Self-pay | Admitting: Internal Medicine

## 2019-01-27 ENCOUNTER — Encounter: Payer: BLUE CROSS/BLUE SHIELD | Admitting: Internal Medicine

## 2019-02-03 ENCOUNTER — Other Ambulatory Visit: Payer: Self-pay

## 2019-02-03 ENCOUNTER — Encounter: Payer: Self-pay | Admitting: Internal Medicine

## 2019-02-03 ENCOUNTER — Ambulatory Visit (INDEPENDENT_AMBULATORY_CARE_PROVIDER_SITE_OTHER): Payer: BC Managed Care – PPO | Admitting: Internal Medicine

## 2019-02-03 VITALS — BP 106/63 | Temp 98.0°F | Wt 110.0 lb

## 2019-02-03 DIAGNOSIS — Z0000 Encounter for general adult medical examination without abnormal findings: Secondary | ICD-10-CM

## 2019-02-03 DIAGNOSIS — E039 Hypothyroidism, unspecified: Secondary | ICD-10-CM | POA: Diagnosis not present

## 2019-02-03 MED ORDER — TETANUS-DIPHTH-ACELL PERTUSSIS 5-2.5-18.5 LF-MCG/0.5 IM SUSP: 0.5000 mL | Freq: Once | INTRAMUSCULAR | 0 refills | Status: AC

## 2019-02-03 NOTE — Progress Notes (Signed)
Subjective:    Patient ID: Candice Brennan, female    DOB: 04/07/1956, 63 y.o.   MRN: [id number]  DOS:  02/03/2019 Type of visit - description: Attempted  to make this a video visit, due to technical difficulties from the patient side it was not possible  thus we proceeded with a Virtual Visit via Telephone    I connected with@   by telephone and verified that I am speaking with the correct person using two identifiers.  THIS ENCOUNTER IS A VIRTUAL VISIT DUE TO COVID-19 - PATIENT WAS NOT SEEN IN THE OFFICE. PATIENT HAS CONSENTED TO VIRTUAL VISIT / TELEMEDICINE VISIT   Location of patient: home  Location of provider: office  I discussed the limitations, risks, security and privacy concerns of performing an evaluation and management service by telephone and the availability of in person appointments. I also discussed with the patient that there may be a patient responsible charge related to this service. The patient expressed understanding and agreed to proceed.  History of Present Illness: Complete physical exam In general she is doing very well and has no major concerns. She remains extremely active.   Review of Systems A 14 point review of systems is negative    Past Medical History:  Diagnosis Date  . Arthritis   . BCC (basal cell carcinoma of skin) 07/2017   R leg  . Hypothyroidism 10/10/2009   Qualifier: Diagnosis of  By: Linna Darner MD, Gwyndolyn Saxon   04/27/2009 TSH 7.66, free T4 0.86, free T3 2.5  01/02/13 Synthroid increased to 25 mcg one daily except 1-1/2 pills Tuesday and Thursday. Repeat TSH recommended 4-6 months after increase.   . OSTEOPOROSIS 12/22/2007  . Tremor 07/21/2013    Past Surgical History:  Procedure Laterality Date  . colonoscopy with polypectomy  2009   Dr Ardis Hughs   . ENDOMETRIAL ABLATION     laparoscopic  . varicose vein treatment Right 05-2015   Family History  Problem Relation Age of Onset  . Breast cancer Mother   . Osteoporosis Mother   . Colon  cancer Maternal Aunt   . Heart disease Maternal Uncle        MI ? age  . Lung cancer Maternal Uncle   . Stroke Maternal Grandmother        in 46s  . Stroke Maternal Grandfather        in 97s  . Osteoporosis Maternal Aunt         X 2  . Diabetes Neg Hx   . Liver cancer Neg Hx   . Pancreatic cancer Neg Hx   . Rectal cancer Neg Hx   . Stomach cancer Neg Hx     Social History   Socioeconomic History  . Marital status: Married    Spouse name: Not on file  . Number of children: 0  . Years of education: Not on file  . Highest education level: Not on file  Occupational History  . Occupation: retired -- Pharmacist, community   Social Needs  . Financial resource strain: Not on file  . Food insecurity    Worry: Not on file    Inability: Not on file  . Transportation needs    Medical: Not on file    Non-medical: Not on file  Tobacco Use  . Smoking status: Former Smoker    Quit date: 04/24/1983    Years since quitting: 35.8  . Smokeless tobacco: Never Used  . Tobacco comment: smoked [place], up to 1  ppd  Substance and Sexual Activity  . Alcohol use: Yes    Comment:  occasionally  . Drug use: Not on file  . Sexual activity: Not on file  Lifestyle  . Physical activity    Days per week: Not on file    Minutes per session: Not on file  . Stress: Not on file  Relationships  . Social Herbalist on phone: Not on file    Gets together: Not on file    Attends religious service: Not on file    Active member of club or organization: Not on file    Attends meetings of clubs or organizations: Not on file    Relationship status: Not on file  . Intimate partner violence    Fear of current or ex partner: Not on file    Emotionally abused: Not on file    Physically abused: Not on file    Forced sexual activity: Not on file  Other Topics Concern  . Not on file  Social History Narrative   Has 1 step daughter       Allergies as of 02/03/2019      Reactions    Penicillins    Angioedema as child Because of a history of documented adverse serious drug reaction;Medi Alert bracelet  is recommended   Fosamax [alendronate Sodium]    05/29/12 "jaw issues"      Medication List       Accurate as of February 03, 2019 11:59 PM. If you have any questions, ask your nurse or doctor.        STOP taking these medications   OVER THE COUNTER MEDICATION Stopped by: Kathlene November, MD     TAKE these medications   Biotin Maximum Strength 10 MG Tabs Generic drug: Biotin Take 1 tablet by mouth at bedtime.   cholecalciferol 1000 units tablet Commonly known as: VITAMIN D Take 1,000 Units by mouth daily. Reported on 07/04/2015   levothyroxine 75 MCG tablet Commonly known as: SYNTHROID Take 1 tablet (75 mcg total) by mouth daily before breakfast.   LUTEIN-ZEAXANTHIN PO Take 1 tablet by mouth daily.   multivitamin tablet Take 1 tablet by mouth daily.   NON FORMULARY Take 2 tablets by mouth every morning. Omega Q Plus   NON FORMULARY Co-Q10 100mg  Take 2 tsp in the morning.   OMEGA-3 1450 PO Take 2 tablets by mouth daily.   OSTEO BI-FLEX TRIPLE STRENGTH PO Take 1 tablet by mouth 2 (two) times daily. Osteo Bi-Flex   OVER THE COUNTER MEDICATION Tumeric Curcumin 500 mg every morning.   OVER THE COUNTER MEDICATION Osteo- Bi- Flex, One tablet two times daily.   Tdap 5-2.5-18.5 LF-MCG/0.5 injection Commonly known as: BOOSTRIX Inject 0.5 mLs into the muscle once for 1 dose. Started by: Kathlene November, MD           Objective:   Physical Exam BP 106/63   Temp 98 F (36.7 C)   Wt 110 lb (49.9 kg)   BMI 19.49 kg/m  This is a virtual phone visit.  She is alert oriented x3, in no apparent distress.    Assessment     Assessment  hypothyroidism Osteoporosis dx ~2009 --dexa 2013: T score  -2.4 s/p fosamax x 5 years --T score 01-2015 -1.1 Tremors  Dx  2015 Menopause +FH breast Ca (mother) [place] 07/2017, R leg  PLAN: Here for CPX Hypothyroidism:  Good compliance with Synthroid, checking labs. Osteoporosis: on calcium, vitamin D, very good physical  activity.  Declined a bone density test because she is status 1 take a medication again, status post Fosamax, had s/e. Advised the patient about alternatives (Prolia). BCC: Reports she sees dermatology regularly Also, she elected to do phone visit for this physical exam, didn't like to come to the office due to COVID-19.Marland Kitchen Patient states she was told by one of my staff members the CPX will be covered by her insurance. RTC 1 year    I discussed the assessment and treatment plan with the patient. The patient was provided an opportunity to ask questions and all were answered. The patient agreed with the plan and demonstrated an understanding of the instructions.   The patient was advised to call back or seek an in-person evaluation if the symptoms worsen or if the condition fails to improve as anticipated.  I provided 25 minutes of non-face-to-face time during this encounter.  Kathlene November, MD

## 2019-02-04 NOTE — Assessment & Plan Note (Signed)
-  Td 2010.  Rx printed and mailed to the patient. - PNM: not indicated  - shingrix: declined  - had a  flu shot    - FH breast ca, mother dx age 62: Last MMG 01-2018, recommend a MMG this year but she declines. She has a FH, encouraged to call me if she changes her mind -Last gyn visit 2012, no further gynecological visit.  See previous entries -CCS: Cscope 2009, 1 polyp ; cscope 09/2017, next per GI  -Palpable Ao-- [place] (-) AAA 2016  -labs: CMP, FLP, CBC, TSH. -Lifestyle is very healthy

## 2019-02-04 NOTE — Assessment & Plan Note (Signed)
Here for CPX Hypothyroidism: Good compliance with Synthroid, checking labs. Osteoporosis: on calcium, vitamin D, very good physical activity.  Declined a bone density test because she is status 1 take a medication again, status post Fosamax, had s/e. Advised the patient about alternatives (Prolia). BCC: Reports she sees dermatology regularly Also, she elected to do phone visit for this physical exam, didn't like to come to the office due to COVID-19.Marland Kitchen Patient states she was told by one of my staff members the CPX will be covered by her insurance. RTC 1 year

## 2019-02-06 ENCOUNTER — Other Ambulatory Visit: Payer: Self-pay

## 2019-02-06 ENCOUNTER — Other Ambulatory Visit (INDEPENDENT_AMBULATORY_CARE_PROVIDER_SITE_OTHER): Payer: BC Managed Care – PPO

## 2019-02-06 DIAGNOSIS — Z0000 Encounter for general adult medical examination without abnormal findings: Secondary | ICD-10-CM

## 2019-02-06 DIAGNOSIS — E039 Hypothyroidism, unspecified: Secondary | ICD-10-CM

## 2019-02-06 LAB — TSH: TSH: 2.83 u[IU]/mL (ref 0.35–4.50)

## 2019-02-06 LAB — COMPREHENSIVE METABOLIC PANEL
ALT: 16 U/L (ref 0–35)
AST: 19 U/L (ref 0–37)
Albumin: 4.5 g/dL (ref 3.5–5.2)
Alkaline Phosphatase: 84 U/L (ref 39–117)
BUN: 14 mg/dL (ref 6–23)
CO2: 30 mEq/L (ref 19–32)
Calcium: 9.3 mg/dL (ref 8.4–10.5)
Chloride: 102 mEq/L (ref 96–112)
Creatinine, Ser: 0.86 mg/dL (ref 0.40–1.20)
GFR: 66.7 mL/min (ref >60.00)
Glucose, Bld: 89 mg/dL (ref 70–99)
Potassium: 4.1 mEq/L (ref 3.5–5.1)
Sodium: 141 mEq/L (ref 135–145)
Total Bilirubin: 0.9 mg/dL (ref 0.2–1.2)
Total Protein: 6.7 g/dL (ref 6.0–8.3)

## 2019-02-06 LAB — LIPID PANEL
Cholesterol: 224 mg/dL — ABNORMAL HIGH (ref 0–200)
HDL: 95.3 mg/dL (ref >39.00)
LDL Cholesterol: 118 mg/dL — ABNORMAL HIGH (ref 0–99)
NonHDL: 129.12
Total CHOL/HDL Ratio: 2
Triglycerides: 58 mg/dL (ref 0.0–149.0)
VLDL: 11.6 mg/dL (ref 0.0–40.0)

## 2019-02-06 LAB — CBC WITH DIFFERENTIAL/PLATELET
Basophils Absolute: 0 10*3/uL (ref 0.0–0.1)
Basophils Relative: 0.7 % (ref 0.0–3.0)
Eosinophils Absolute: 0.1 10*3/uL (ref 0.0–0.7)
Eosinophils Relative: 2.5 % (ref 0.0–5.0)
HCT: 43.4 % (ref 36.0–46.0)
Hemoglobin: 14.4 g/dL (ref 12.0–15.0)
Lymphocytes Relative: 31.3 % (ref 12.0–46.0)
Lymphs Abs: 1.7 10*3/uL (ref 0.7–4.0)
MCHC: 33.1 g/dL (ref 30.0–36.0)
MCV: 95.1 fl (ref 78.0–100.0)
Monocytes Absolute: 0.6 10*3/uL (ref 0.1–1.0)
Monocytes Relative: 10 % (ref 3.0–12.0)
Neutro Abs: 3.1 10*3/uL (ref 1.4–7.7)
Neutrophils Relative %: 55.5 % (ref 43.0–77.0)
Platelets: 217 10*3/uL (ref 150.0–400.0)
RBC: 4.56 Mil/uL (ref 3.87–5.11)
RDW: 14.3 % (ref 11.5–15.5)
WBC: 5.5 10*3/uL (ref 4.0–10.5)

## 2019-02-09 ENCOUNTER — Ambulatory Visit: Payer: BLUE CROSS/BLUE SHIELD

## 2019-04-18 ENCOUNTER — Other Ambulatory Visit: Payer: Self-pay | Admitting: Internal Medicine

## 2019-06-11 DIAGNOSIS — H43811 Vitreous degeneration, right eye: Secondary | ICD-10-CM | POA: Diagnosis not present

## 2019-06-15 DIAGNOSIS — D3131 Benign neoplasm of right choroid: Secondary | ICD-10-CM | POA: Diagnosis not present

## 2019-06-15 DIAGNOSIS — H43812 Vitreous degeneration, left eye: Secondary | ICD-10-CM | POA: Diagnosis not present

## 2019-06-15 DIAGNOSIS — H4311 Vitreous hemorrhage, right eye: Secondary | ICD-10-CM | POA: Diagnosis not present

## 2019-06-15 DIAGNOSIS — H43811 Vitreous degeneration, right eye: Secondary | ICD-10-CM | POA: Diagnosis not present

## 2019-06-15 DIAGNOSIS — H33321 Round hole, right eye: Secondary | ICD-10-CM | POA: Diagnosis not present

## 2019-06-15 DIAGNOSIS — H2513 Age-related nuclear cataract, bilateral: Secondary | ICD-10-CM | POA: Diagnosis not present

## 2019-06-22 DIAGNOSIS — H4311 Vitreous hemorrhage, right eye: Secondary | ICD-10-CM | POA: Diagnosis not present

## 2019-06-22 DIAGNOSIS — H33321 Round hole, right eye: Secondary | ICD-10-CM | POA: Diagnosis not present

## 2019-07-01 ENCOUNTER — Other Ambulatory Visit: Payer: Self-pay | Admitting: Internal Medicine

## 2019-07-20 DIAGNOSIS — H33321 Round hole, right eye: Secondary | ICD-10-CM | POA: Diagnosis not present

## 2019-07-20 DIAGNOSIS — H31091 Other chorioretinal scars, right eye: Secondary | ICD-10-CM | POA: Diagnosis not present

## 2019-09-14 ENCOUNTER — Telehealth: Payer: Self-pay | Admitting: Internal Medicine

## 2019-09-14 NOTE — Telephone Encounter (Signed)
Caller: Savannha Call back phone number: [phone]  Patient would like to know if is ok for her to take the Covid shot.   Please advice.

## 2019-09-14 NOTE — Telephone Encounter (Signed)
Please advise 

## 2019-09-14 NOTE — Telephone Encounter (Signed)
Spoke w/ Pt's husband, Jenny Reichmann- informed of PCP recommendations. John verbalized understanding.

## 2019-09-14 NOTE — Telephone Encounter (Signed)
Yes, I strongly recommended. If she asks which  one my preference would be [employer]

## 2019-10-14 ENCOUNTER — Other Ambulatory Visit: Payer: Self-pay | Admitting: Internal Medicine

## 2019-10-19 DIAGNOSIS — H43813 Vitreous degeneration, bilateral: Secondary | ICD-10-CM | POA: Diagnosis not present

## 2019-10-19 DIAGNOSIS — H2513 Age-related nuclear cataract, bilateral: Secondary | ICD-10-CM | POA: Diagnosis not present

## 2019-10-19 DIAGNOSIS — H31091 Other chorioretinal scars, right eye: Secondary | ICD-10-CM | POA: Diagnosis not present

## 2019-10-19 DIAGNOSIS — H35371 Puckering of macula, right eye: Secondary | ICD-10-CM | POA: Diagnosis not present

## 2019-11-13 ENCOUNTER — Encounter: Payer: Self-pay | Admitting: Internal Medicine

## 2019-11-15 IMAGING — MG DIGITAL DIAGNOSTIC BILATERAL MAMMOGRAM WITH TOMO AND CAD
6 of 10 series · 6 of 30 positions shown · non-contrast
Comparison: None available

CLINICAL DATA: 62-year-old patient with palpable mass along the
medial aspect of the right axilla.

EXAM:
DIGITAL DIAGNOSTIC BILATERAL MAMMOGRAM WITH CAD AND TOMO
ULTRASOUND RIGHT AXILLA

[R CC synth-2D]
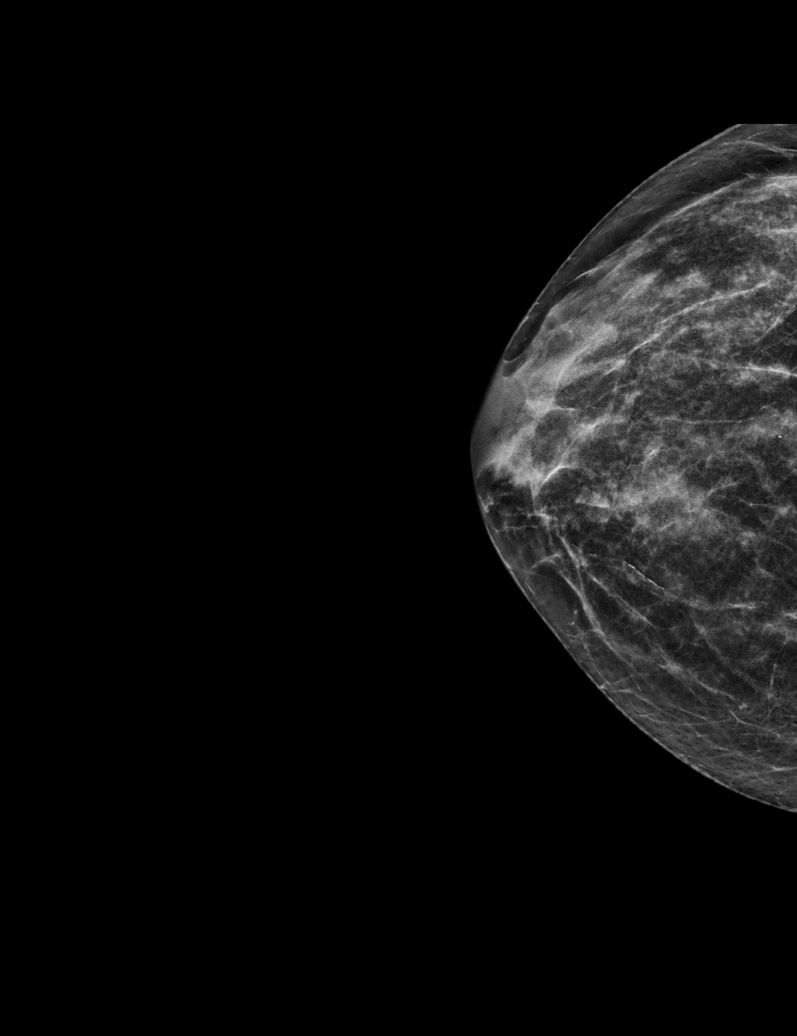

[R MLO synth-2D (1 of 2)]
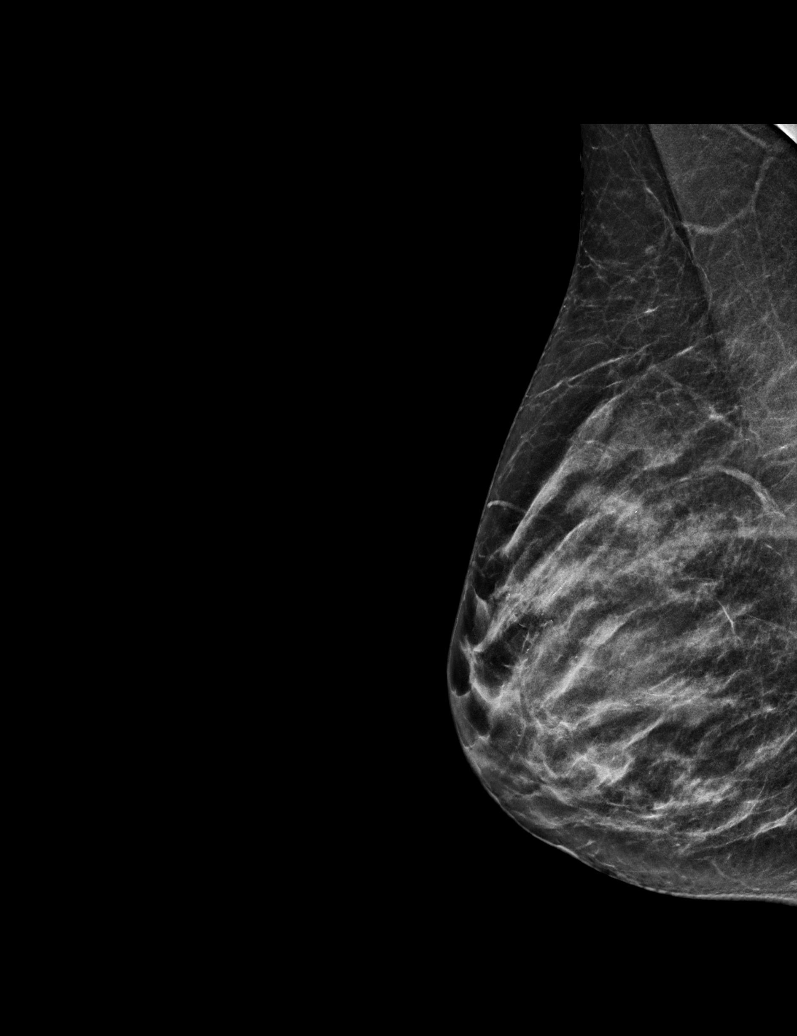

[L MLO synth-2D]
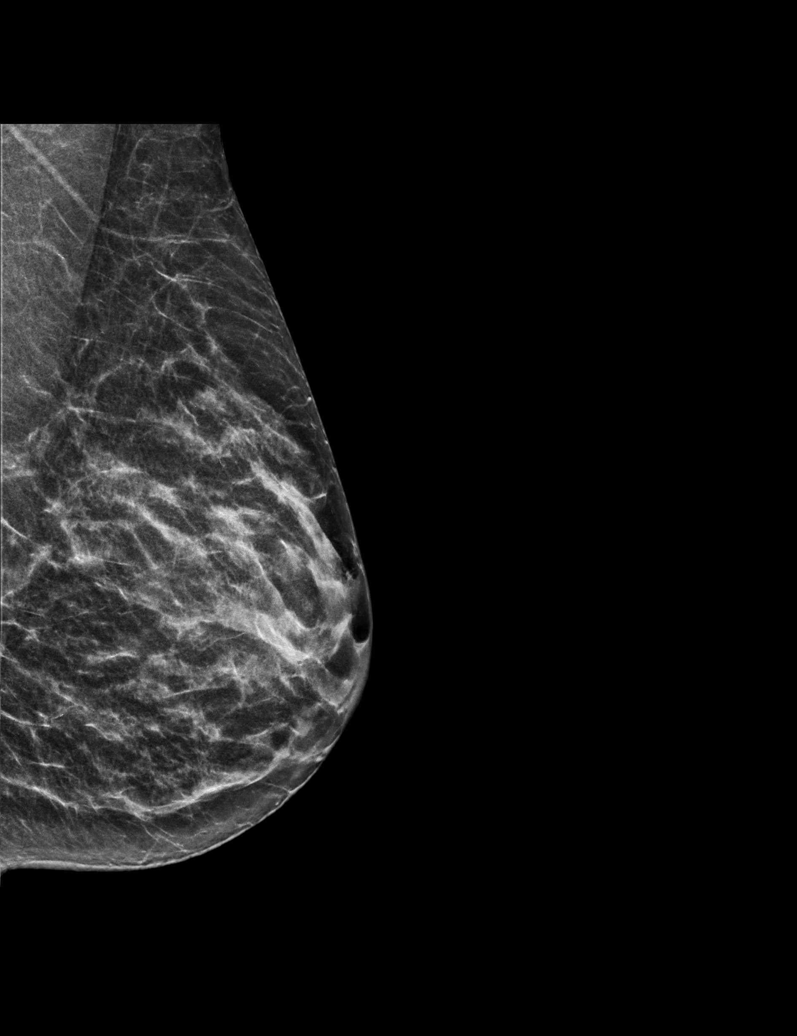

[R MLO synth-2D (2 of 2)]
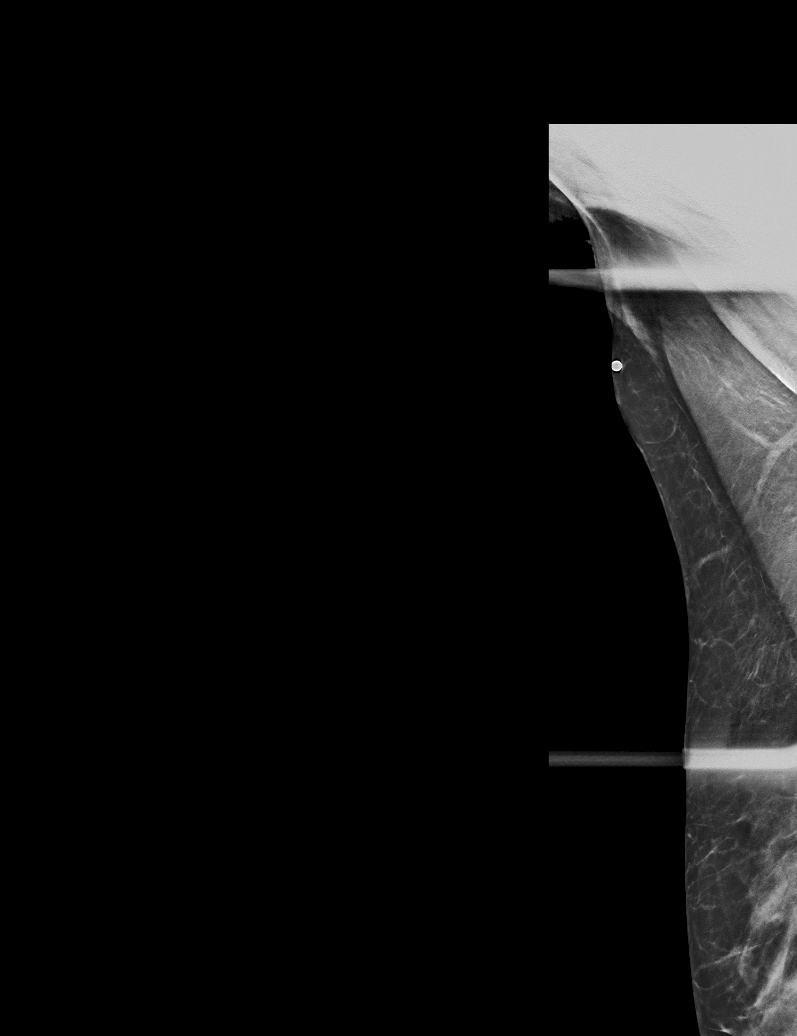

[L CC synth-2D]
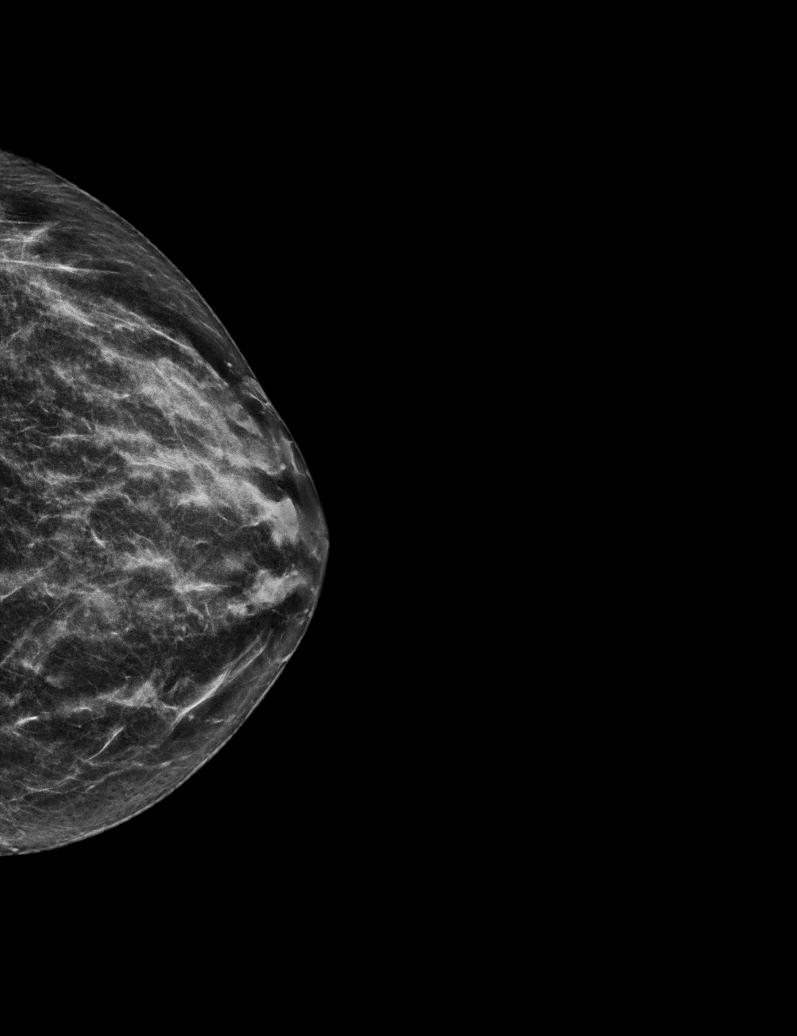

[R MLO tomo · tomo slice 27/52.0]
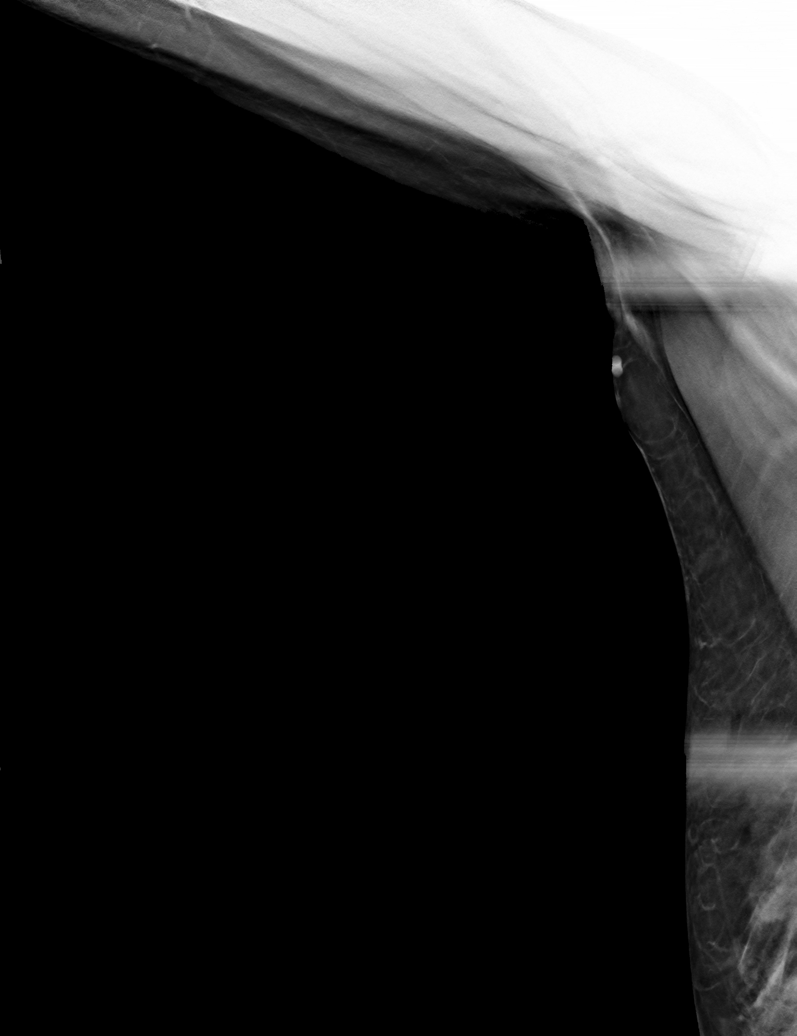

[6 of 30 positions shown; findings below may reference images not displayed]

ACR Breast Density Category d: The breast tissue is extremely dense,
which lowers the sensitivity of mammography.
FINDINGS: No mass, architectural distortion, or suspicious microcalcification
is identified to suggest malignancy in either breast.

Spot tangential view of the region of palpable concern in the right
axilla shows a lucent fatty circumscribed oval mass immediately deep
to the metallic skin marker. Mammographic findings suggest a benign
lipoma.

Mammographic images were processed with CAD.

On physical exam, I palpate a soft approximately 2 cm superficial
mass along the medial aspect of the right axilla. This causes the
skin the protrude in this region.

Targeted ultrasound is performed, showing a circumscribed fatty mass
immediately subjacent to the skin measuring 1.6 x 0.8 x 2.0 cm.
Ultrasound appearance is consistent with a benign lipoma.
IMPRESSION: The palpable mass in the medial right axilla is a 2.0 cm benign
lipoma.

No evidence of malignancy in either breast.

RECOMMENDATION:
Screening mammogram in one year.(Code:DX-J-VSQ)

I have discussed the findings and recommendations with the patient.
Results were also provided in writing at the conclusion of the
visit. If applicable, a reminder letter will be sent to the patient
regarding the next appointment.

BI-RADS CATEGORY  2: Benign.

## 2019-11-16 DIAGNOSIS — H31091 Other chorioretinal scars, right eye: Secondary | ICD-10-CM | POA: Diagnosis not present

## 2019-11-16 DIAGNOSIS — D3131 Benign neoplasm of right choroid: Secondary | ICD-10-CM | POA: Diagnosis not present

## 2019-11-16 DIAGNOSIS — H43811 Vitreous degeneration, right eye: Secondary | ICD-10-CM | POA: Diagnosis not present

## 2019-11-16 DIAGNOSIS — H2513 Age-related nuclear cataract, bilateral: Secondary | ICD-10-CM | POA: Diagnosis not present

## 2020-02-05 ENCOUNTER — Encounter: Payer: BC Managed Care – PPO | Admitting: Internal Medicine

## 2020-04-04 ENCOUNTER — Other Ambulatory Visit: Payer: Self-pay

## 2020-04-04 ENCOUNTER — Encounter: Payer: Self-pay | Admitting: Internal Medicine

## 2020-04-04 ENCOUNTER — Ambulatory Visit (INDEPENDENT_AMBULATORY_CARE_PROVIDER_SITE_OTHER): Payer: BC Managed Care – PPO | Admitting: Internal Medicine

## 2020-04-04 VITALS — BP 100/62 | HR 63 | Temp 98.4°F | Ht 63.0 in | Wt 107.6 lb

## 2020-04-04 DIAGNOSIS — E039 Hypothyroidism, unspecified: Secondary | ICD-10-CM

## 2020-04-04 DIAGNOSIS — Z0000 Encounter for general adult medical examination without abnormal findings: Secondary | ICD-10-CM

## 2020-04-04 DIAGNOSIS — Z23 Encounter for immunization: Secondary | ICD-10-CM

## 2020-04-04 NOTE — Progress Notes (Signed)
Subjective:    Patient ID: Candice Brennan, female    DOB: June 15, 1955, 64 y.o.   MRN: [id number]  DOS:  04/04/2020 Type of visit - description: CPX, here with her husband No major concerns except a lot of stress for a variety of reasons but no anxiety or depression per se.   Wt Readings from Last 3 Encounters:  04/04/20 107 lb 9.6 oz (48.8 kg)  02/03/19 110 lb (49.9 kg)  01/21/18 114 lb 4 oz (51.8 kg)     Review of Systems  Other than above, a 14 point review of systems is negative     Past Medical History:  Diagnosis Date  . Arthritis   . BCC (basal cell carcinoma of skin) 07/2017   R leg  . Hypothyroidism 10/10/2009   Qualifier: Diagnosis of  By: Linna Darner MD, Gwyndolyn Saxon   04/27/2009 TSH 7.66, free T4 0.86, free T3 2.5  01/02/13 Synthroid increased to 25 mcg one daily except 1-1/2 pills Tuesday and Thursday. Repeat TSH recommended 4-6 months after increase.   . OSTEOPOROSIS 12/22/2007  . Tremor 07/21/2013    Past Surgical History:  Procedure Laterality Date  . colonoscopy with polypectomy  2009   Dr Ardis Hughs   . ENDOMETRIAL ABLATION     laparoscopic  . varicose vein treatment Right 05-2015    Allergies as of 04/04/2020      Reactions   Penicillins    Angioedema as child Because of a history of documented adverse serious drug reaction;Medi Alert bracelet  is recommended   Fosamax [alendronate Sodium]    05/29/12 "jaw issues"      Medication List       Accurate as of April 04, 2020 11:59 PM. If you have any questions, ask your nurse or doctor.        Biotin 10 MG Tabs Take 1 tablet by mouth at bedtime.   Calcium 500/D 500-200 MG-UNIT Tabs Generic drug: Calcium Carb-Cholecalciferol Take by mouth. One in the morning and one in the evening   cholecalciferol 1000 units tablet Commonly known as: VITAMIN D Take 1,000 Units by mouth daily. Reported on 07/04/2015   Euthyrox 75 MCG tablet Generic drug: levothyroxine Take 75 mcg by mouth daily before  breakfast. What changed: Another medication with the same name was removed. Continue taking this medication, and follow the directions you see here. Changed by: Kathlene November, MD   LUTEIN-ZEAXANTHIN PO Take 1 tablet by mouth daily.   multivitamin tablet Take 1 tablet by mouth daily.   NON FORMULARY Take 2 tablets by mouth every morning. Omega Q Plus   NON FORMULARY Co-Q10 100mg  Take 2 tsp in the morning.   OMEGA-3 1450 PO Take 2 tablets by mouth daily.   OSTEO BI-FLEX TRIPLE STRENGTH PO Take 1 tablet by mouth 2 (two) times daily. Osteo Bi-Flex   OVER THE COUNTER MEDICATION Tumeric Curcumin 500 mg one in morning and one in Evening          Objective:   Physical Exam BP 100/62 (BP Location: Left Arm, Patient Position: Sitting, Cuff Size: Large)   Pulse 63   Temp 98.4 F (36.9 C) (Oral)   Ht 5\' 3"  (1.6 m)   Wt 107 lb 9.6 oz (48.8 kg)   SpO2 99%   BMI 19.06 kg/m  General: Well developed, NAD, BMI noted Neck: No  thyromegaly  HEENT:  Normocephalic . Face symmetric, atraumatic Breast exam: Breast tissue is dense but no dominant mass.  No axillary LAD Lungs:  CTA B Normal respiratory effort, no intercostal retractions, no accessory muscle use. Heart: RRR,  no murmur.  Abdomen:  Not distended, soft, non-tender. No rebound or rigidity.   Lower extremities: no pretibial edema bilaterally  Skin: Exposed areas without rash. Not pale. Not jaundice Neurologic:  alert & oriented X3.  Speech normal, gait appropriate for age and unassisted Strength symmetric and appropriate for age.  Psych: Cognition and judgment appear intact.  Cooperative with normal attention span and concentration.  Behavior appropriate. No anxious or depressed appearing.     Assessment    Assessment  hypothyroidism Osteoporosis dx ~2009 Took Fosamax from 2009-2014, patient believes it affected her jaw.  Not willing to take medications again --dexa 2013: T score  -2.4 s/p fosamax x 5 years --T  score 01-2015 -1.1 Tremors  Dx  2015 Menopause +FH breast Ca (mother) [place] 07/2017, R leg  PLAN: Here for CPX Hypothyroidism: Checking labs Osteoporosis: Recommend DEXA, declined, she is not willing to take any medications.  On vitamin D.  Risk of fractures discussed. Weight loss noted: The patient is eating eating healthier than before and she is not surprised about weight loss. History of BCC, recommend to see dermatology yearly. RTC 1 year  This visit occurred during the SARS-CoV-2 public health emergency.  Safety protocols were in place, including screening questions prior to the visit, additional usage of staff PPE, and extensive cleaning of exam room while observing appropriate contact time as indicated for disinfecting solutions.

## 2020-04-04 NOTE — Patient Instructions (Addendum)
See your dermatologist yearly   GO TO THE LAB : Get the blood work     [place], [place] back for   a physical exam in 1 year    LEARN ABOUT CALCIUM AND VITAMIN D  Calcium and Vitamin D are important to help keep your bones healthy and prevent osteoporosis. Healthy calcium and Vitamin D levels can be reached by increasing calcium and vitamin D in your diet, or by taking supplements over the counter.  The current recommendations are as follows:  Increasing calcium and vitamin D is generally well tolerated in most people. Some people may get constipated (with CALCIUM).   RECCOMENDATIONS -Postmenopausal women:    1200mg  calcium and 800 IU (20 micrograms) vitamin D daily

## 2020-04-05 ENCOUNTER — Encounter: Payer: Self-pay | Admitting: Internal Medicine

## 2020-04-05 LAB — CBC WITH DIFFERENTIAL/PLATELET
Basophils Absolute: 0 10*3/uL (ref 0.0–0.1)
Basophils Relative: 0.7 % (ref 0.0–3.0)
Eosinophils Absolute: 0.2 10*3/uL (ref 0.0–0.7)
Eosinophils Relative: 3 % (ref 0.0–5.0)
HCT: 40.3 % (ref 36.0–46.0)
Hemoglobin: 13.6 g/dL (ref 12.0–15.0)
Lymphocytes Relative: 31.5 % (ref 12.0–46.0)
Lymphs Abs: 1.7 10*3/uL (ref 0.7–4.0)
MCHC: 33.6 g/dL (ref 30.0–36.0)
MCV: 94 fl (ref 78.0–100.0)
Monocytes Absolute: 0.5 10*3/uL (ref 0.1–1.0)
Monocytes Relative: 10.1 % (ref 3.0–12.0)
Neutro Abs: 2.9 10*3/uL (ref 1.4–7.7)
Neutrophils Relative %: 54.7 % (ref 43.0–77.0)
Platelets: 214 10*3/uL (ref 150.0–400.0)
RBC: 4.29 Mil/uL (ref 3.87–5.11)
RDW: 14.1 % (ref 11.5–15.5)
WBC: 5.3 10*3/uL (ref 4.0–10.5)

## 2020-04-05 LAB — COMPREHENSIVE METABOLIC PANEL
ALT: 22 U/L (ref 0–35)
AST: 21 U/L (ref 0–37)
Albumin: 4.2 g/dL (ref 3.5–5.2)
Alkaline Phosphatase: 69 U/L (ref 39–117)
BUN: 14 mg/dL (ref 6–23)
CO2: 32 mEq/L (ref 19–32)
Calcium: 9.7 mg/dL (ref 8.4–10.5)
Chloride: 102 mEq/L (ref 96–112)
Creatinine, Ser: 0.84 mg/dL (ref 0.40–1.20)
GFR: 73.69 mL/min (ref >60.00)
Glucose, Bld: 84 mg/dL (ref 70–99)
Potassium: 3.7 mEq/L (ref 3.5–5.1)
Sodium: 141 mEq/L (ref 135–145)
Total Bilirubin: 0.5 mg/dL (ref 0.2–1.2)
Total Protein: 6.4 g/dL (ref 6.0–8.3)

## 2020-04-05 LAB — LIPID PANEL
Cholesterol: 194 mg/dL (ref 0–200)
HDL: 91.7 mg/dL (ref >39.00)
LDL Cholesterol: 91 mg/dL (ref 0–99)
NonHDL: 101.98
Total CHOL/HDL Ratio: 2
Triglycerides: 55 mg/dL (ref 0.0–149.0)
VLDL: 11 mg/dL (ref 0.0–40.0)

## 2020-04-05 LAB — TSH: TSH: 1.05 u[IU]/mL (ref 0.35–4.50)

## 2020-04-05 NOTE — Assessment & Plan Note (Signed)
-  Td 2020 - PNM: not indicated  - shingrix: declined  -COVID vaccine x2, rec booster  - flu shot today   - FH breast ca, mother dx age 63: Last MMG 01-2018, + FH,  declined referral for MMG, SBE neg per pt, clinical breast exam today: Normal. -CCS: Cscope 2009, 1 polyp ; cscope 09/2017, next per GI  -Palpable Ao-- [place] (-) AAA 2016  -labs: CMP, FLP, CBC, TSH -Lifestyle is very healthy

## 2020-04-05 NOTE — Assessment & Plan Note (Signed)
Here for CPX Hypothyroidism: Checking labs Osteoporosis: Recommend DEXA, declined, she is not willing to take any medications.  On vitamin D.  Risk of fractures discussed. Weight loss noted: The patient is eating eating healthier than before and she is not surprised about weight loss. History of BCC, recommend to see dermatology yearly. RTC 1 year

## 2020-05-16 ENCOUNTER — Encounter: Payer: BC Managed Care – PPO | Admitting: Internal Medicine

## 2020-05-25 ENCOUNTER — Other Ambulatory Visit: Payer: Self-pay | Admitting: Internal Medicine

## 2020-05-25 DIAGNOSIS — Z1231 Encounter for screening mammogram for malignant neoplasm of breast: Secondary | ICD-10-CM

## 2020-07-19 ENCOUNTER — Encounter: Payer: Self-pay | Admitting: Internal Medicine

## 2020-07-21 ENCOUNTER — Ambulatory Visit
Admission: RE | Admit: 2020-07-21 | Discharge: 2020-07-21 | Disposition: A | Discharge status: Home / Self Care | Payer: BC Managed Care – PPO | Source: Ambulatory Visit | Attending: Internal Medicine | Admitting: Internal Medicine

## 2020-07-21 ENCOUNTER — Other Ambulatory Visit: Payer: Self-pay

## 2020-07-21 DIAGNOSIS — Z1231 Encounter for screening mammogram for malignant neoplasm of breast: Secondary | ICD-10-CM

## 2020-10-15 ENCOUNTER — Other Ambulatory Visit: Payer: Self-pay | Admitting: Internal Medicine

## 2020-11-14 DIAGNOSIS — H43811 Vitreous degeneration, right eye: Secondary | ICD-10-CM | POA: Diagnosis not present

## 2020-11-14 DIAGNOSIS — D3131 Benign neoplasm of right choroid: Secondary | ICD-10-CM | POA: Diagnosis not present

## 2020-11-14 DIAGNOSIS — H2513 Age-related nuclear cataract, bilateral: Secondary | ICD-10-CM | POA: Diagnosis not present

## 2020-11-14 DIAGNOSIS — H31091 Other chorioretinal scars, right eye: Secondary | ICD-10-CM | POA: Diagnosis not present

## 2021-02-09 ENCOUNTER — Ambulatory Visit (INDEPENDENT_AMBULATORY_CARE_PROVIDER_SITE_OTHER): Payer: BC Managed Care – PPO

## 2021-02-09 ENCOUNTER — Other Ambulatory Visit: Payer: Self-pay

## 2021-02-09 DIAGNOSIS — Z23 Encounter for immunization: Secondary | ICD-10-CM | POA: Diagnosis not present

## 2021-04-06 ENCOUNTER — Encounter: Payer: Self-pay | Admitting: Internal Medicine

## 2021-04-06 ENCOUNTER — Ambulatory Visit (INDEPENDENT_AMBULATORY_CARE_PROVIDER_SITE_OTHER): Payer: BC Managed Care – PPO | Admitting: Internal Medicine

## 2021-04-06 VITALS — BP 108/70 | HR 64 | Temp 98.0°F | Resp 16 | Ht 63.0 in | Wt 104.2 lb

## 2021-04-06 DIAGNOSIS — E039 Hypothyroidism, unspecified: Secondary | ICD-10-CM | POA: Diagnosis not present

## 2021-04-06 DIAGNOSIS — Z0000 Encounter for general adult medical examination without abnormal findings: Secondary | ICD-10-CM

## 2021-04-06 LAB — CBC WITH DIFFERENTIAL/PLATELET
Basophils Absolute: 0 10*3/uL (ref 0.0–0.1)
Basophils Relative: 0.5 % (ref 0.0–3.0)
Eosinophils Absolute: 0.1 10*3/uL (ref 0.0–0.7)
Eosinophils Relative: 3 % (ref 0.0–5.0)
HCT: 42.4 % (ref 36.0–46.0)
Hemoglobin: 14 g/dL (ref 12.0–15.0)
Lymphocytes Relative: 31.5 % (ref 12.0–46.0)
Lymphs Abs: 1.3 10*3/uL (ref 0.7–4.0)
MCHC: 33.1 g/dL (ref 30.0–36.0)
MCV: 95.4 fl (ref 78.0–100.0)
Monocytes Absolute: 0.5 10*3/uL (ref 0.1–1.0)
Monocytes Relative: 11.8 % (ref 3.0–12.0)
Neutro Abs: 2.2 10*3/uL (ref 1.4–7.7)
Neutrophils Relative %: 53.2 % (ref 43.0–77.0)
Platelets: 219 10*3/uL (ref 150.0–400.0)
RBC: 4.44 Mil/uL (ref 3.87–5.11)
RDW: 14.1 % (ref 11.5–15.5)
WBC: 4.2 10*3/uL (ref 4.0–10.5)

## 2021-04-06 LAB — COMPREHENSIVE METABOLIC PANEL
ALT: 14 U/L (ref 0–35)
AST: 17 U/L (ref 0–37)
Albumin: 4.2 g/dL (ref 3.5–5.2)
Alkaline Phosphatase: 110 U/L (ref 39–117)
BUN: 14 mg/dL (ref 6–23)
CO2: 29 mEq/L (ref 19–32)
Calcium: 9.1 mg/dL (ref 8.4–10.5)
Chloride: 104 mEq/L (ref 96–112)
Creatinine, Ser: 0.87 mg/dL (ref 0.40–1.20)
GFR: 70.16 mL/min (ref >60.00)
Glucose, Bld: 87 mg/dL (ref 70–99)
Potassium: 4 mEq/L (ref 3.5–5.1)
Sodium: 140 mEq/L (ref 135–145)
Total Bilirubin: 0.8 mg/dL (ref 0.2–1.2)
Total Protein: 6.5 g/dL (ref 6.0–8.3)

## 2021-04-06 LAB — VITAMIN D 25 HYDROXY (VIT D DEFICIENCY, FRACTURES): VITD: 84.13 ng/mL (ref 30.00–100.00)

## 2021-04-06 LAB — LIPID PANEL
Cholesterol: 203 mg/dL — ABNORMAL HIGH (ref 0–200)
HDL: 99.6 mg/dL (ref >39.00)
LDL Cholesterol: 94 mg/dL (ref 0–99)
NonHDL: 103.22
Total CHOL/HDL Ratio: 2
Triglycerides: 47 mg/dL (ref 0.0–149.0)
VLDL: 9.4 mg/dL (ref 0.0–40.0)

## 2021-04-06 LAB — TSH: TSH: 3.26 u[IU]/mL (ref 0.35–5.50)

## 2021-04-06 NOTE — Assessment & Plan Note (Signed)
-  Td 2020 - PNM: not indicated  - shingrix: benefits d/w pt  -COVID vaccine  booster recommended - got a lu shot today   - FH breast ca, mother dx age 64:  MMG 06-2020  ([facility]), SBE wnl -Pap smears: Has declined consistently, see previous entries -CCS: Cscope 2009, 1 polyp ; cscope 09/2017, next 2029  per GI  -Palpable Ao-- [place] (-) AAA 2016  -labs: CMP, FLP, CBC, TSH, vitamin D -POA package of information provided -Recommend to stay active and exercise regularly.

## 2021-04-06 NOTE — Progress Notes (Signed)
Subjective:    Patient ID: Candice Brennan, female    DOB: Sep 16, 1955, 65 y.o.   MRN: [id number]  DOS:  04/06/2021 Type of visit - description: CPX  CPX today. Since the last visit is feeling well and has no concerns  Review of Systems     A 14 point review of systems is negative    Past Medical History:  Diagnosis Date   Arthritis    BCC (basal cell carcinoma of skin) 07/2017   R leg   Hypothyroidism 10/10/2009   Qualifier: Diagnosis of  By: Linna Darner MD, Gwyndolyn Saxon   04/27/2009 TSH 7.66, free T4 0.86, free T3 2.5  01/02/13 Synthroid increased to 25 mcg one daily except 1-1/2 pills Tuesday and Thursday. Repeat TSH recommended 4-6 months after increase.    OSTEOPOROSIS 12/22/2007   Tremor 07/21/2013    Past Surgical History:  Procedure Laterality Date   colonoscopy with polypectomy  2009   Dr Ardis Hughs    ENDOMETRIAL ABLATION     laparoscopic   varicose vein treatment Right 05-2015   Social History   Socioeconomic History   Marital status: Married    Spouse name: Not on file   Number of children: 0   Years of education: Not on file   Highest education level: Not on file  Occupational History   Occupation: retired -- Pharmacist, community   Tobacco Use   Smoking status: Former    Types: Cigarettes    Quit date: 04/24/1983    Years since quitting: 37.9   Smokeless tobacco: Never   Tobacco comments:    smoked [place], up to 1 ppd  Substance and Sexual Activity   Alcohol use: Yes    Comment:  occasionally   Drug use: Not on file   Sexual activity: Not on file  Other Topics Concern   Not on file  Social History Narrative   Has 1 step daughter    Social Determinants of Health   Financial Resource Strain: Not on file  Food Insecurity: Not on file  Transportation Needs: Not on file  Physical Activity: Not on file  Stress: Not on file  Social Connections: Not on file  Intimate Partner Violence: Not on file    Allergies as of 04/06/2021       Reactions    Penicillins    Angioedema as child Because of a history of documented adverse serious drug reaction;Medi Alert bracelet  is recommended   Fosamax [alendronate Sodium]    05/29/12 "jaw issues"        Medication List        Accurate as of April 06, 2021  7:48 PM. If you have any questions, ask your nurse or doctor.          Biotin 10 MG Tabs Take 1 tablet by mouth at bedtime.   Calcium 500/D 500-5 MG-MCG Tabs Generic drug: Calcium Carb-Cholecalciferol Take by mouth. One in the morning and one in the evening   cholecalciferol 1000 units tablet Commonly known as: VITAMIN D Take 1,000 Units by mouth daily. Reported on 07/04/2015   levothyroxine 75 MCG tablet Commonly known as: SYNTHROID Take 1 tablet (75 mcg total) by mouth daily before breakfast.   LUTEIN-ZEAXANTHIN PO Take 1 tablet by mouth daily.   multivitamin tablet Take 1 tablet by mouth daily.   NON FORMULARY Take 2 tablets by mouth every morning. Omega Q Plus   NON FORMULARY Co-Q10 100mg  Take 2 tsp in the morning.   OMEGA-3 1450  PO Take 2 tablets by mouth daily.   OSTEO BI-FLEX TRIPLE STRENGTH PO Take 1 tablet by mouth 2 (two) times daily. Osteo Bi-Flex   OVER THE COUNTER MEDICATION Tumeric Curcumin 500 mg one in morning and one in Evening           Objective:   Physical Exam BP 108/70 (BP Location: Left Arm, Patient Position: Sitting, Cuff Size: Small)    Pulse 64    Temp 98 F (36.7 C) (Oral)    Resp 16    Ht 5\' 3"  (1.6 m)    Wt 104 lb 4 oz (47.3 kg)    SpO2 99%    BMI 18.47 kg/m  General: Well developed, NAD, BMI noted Neck: No  thyromegaly  HEENT:  Normocephalic . Face symmetric, atraumatic Lungs:  CTA B Normal respiratory effort, no intercostal retractions, no accessory muscle use. Heart: RRR,  no murmur.  Abdomen:  Not distended, soft, non-tender. No rebound or rigidity.  Palpable aorta noted Lower extremities: no pretibial edema bilaterally  Skin: Exposed areas without rash. Not  pale. Not jaundice Neurologic:  alert & oriented X3.  Speech normal, gait appropriate for age and unassisted Strength symmetric and appropriate for age.  Psych: Cognition and judgment appear intact.  Cooperative with normal attention span and concentration.  Behavior appropriate. No anxious or depressed appearing.     Assessment    Assessment  hypothyroidism Osteoporosis dx ~2009 Took Fosamax from 2009-2014, patient believes it affected her jaw.  Not willing to take medications again --dexa 2013: T score  -2.4 s/p fosamax x 5 years --T score 01-2015 -1.1 Tremors  Dx  2015 Menopause +FH breast Ca (mother) [place] 07/2017, R leg  PLAN: Here for CPX Hypothyroidism: On Synthroid. Check TSH,   I asked patient that in the future when she comes to the office to hold biotin for 10 days. Osteoporosis: Due for DEXA, declines, states she is not willing to take medication, had some problems with Fosamax, advised they are newer alternatives. Checking vitamin D.    BCC: History of, recommend to see dermatology regularly, she is somewhat reluctant and states that she self check regularly. RTC 1 year    This visit occurred during the SARS-CoV-2 public health emergency.  Safety protocols were in place, including screening questions prior to the visit, additional usage of staff PPE, and extensive cleaning of exam room while observing appropriate contact time as indicated for disinfecting solutions.

## 2021-04-06 NOTE — Patient Instructions (Addendum)
° ° °  GO TO THE LAB : Get the blood work     [place], [place] Come back for   a physical exam in 1 year advised that they are different medications that Fosamax

## 2021-04-06 NOTE — Assessment & Plan Note (Signed)
Here for CPX Hypothyroidism: On Synthroid. Check TSH,   I asked patient that in the future when she comes to the office to hold biotin for 10 days. Osteoporosis: Due for DEXA, declines, states she is not willing to take medication, had some problems with Fosamax, advised they are newer alternatives. Checking vitamin D.    BCC: History of, recommend to see dermatology regularly, she is somewhat reluctant and states that she self check regularly. RTC 1 year

## 2021-04-10 ENCOUNTER — Other Ambulatory Visit: Payer: Self-pay | Admitting: Internal Medicine

## 2021-06-14 ENCOUNTER — Other Ambulatory Visit: Payer: Self-pay | Admitting: Internal Medicine

## 2021-06-14 DIAGNOSIS — Z1231 Encounter for screening mammogram for malignant neoplasm of breast: Secondary | ICD-10-CM

## 2021-07-24 ENCOUNTER — Ambulatory Visit
Admission: RE | Admit: 2021-07-24 | Discharge: 2021-07-24 | Disposition: A | Discharge status: Home / Self Care | Payer: Medicare Other | Source: Ambulatory Visit | Attending: Internal Medicine | Admitting: Internal Medicine

## 2021-07-24 DIAGNOSIS — Z1231 Encounter for screening mammogram for malignant neoplasm of breast: Secondary | ICD-10-CM

## 2021-10-23 ENCOUNTER — Telehealth: Payer: Self-pay | Admitting: Internal Medicine

## 2021-10-23 DIAGNOSIS — H6120 Impacted cerumen, unspecified ear: Secondary | ICD-10-CM

## 2021-10-23 NOTE — Telephone Encounter (Signed)
ENT referral placed.

## 2021-10-23 NOTE — Telephone Encounter (Signed)
Pt would like a referral to an ENT dr. She was at an UC and they used a water cleaner for ear clogging and it did not work. She declined an appt at our office as she does not know what we use to clean ears and thinks it may not help. She tried to call ENT drs herself but was not able to get any appt within the month.

## 2021-10-30 DIAGNOSIS — H31091 Other chorioretinal scars, right eye: Secondary | ICD-10-CM | POA: Diagnosis not present

## 2021-10-30 DIAGNOSIS — D3131 Benign neoplasm of right choroid: Secondary | ICD-10-CM | POA: Diagnosis not present

## 2021-10-30 DIAGNOSIS — H2513 Age-related nuclear cataract, bilateral: Secondary | ICD-10-CM | POA: Diagnosis not present

## 2021-10-30 DIAGNOSIS — H43811 Vitreous degeneration, right eye: Secondary | ICD-10-CM | POA: Diagnosis not present

## 2022-01-16 DIAGNOSIS — H6123 Impacted cerumen, bilateral: Secondary | ICD-10-CM | POA: Diagnosis not present

## 2022-02-15 ENCOUNTER — Ambulatory Visit (INDEPENDENT_AMBULATORY_CARE_PROVIDER_SITE_OTHER): Payer: Medicare Other

## 2022-02-15 DIAGNOSIS — Z23 Encounter for immunization: Secondary | ICD-10-CM

## 2022-02-22 ENCOUNTER — Encounter: Payer: Self-pay | Admitting: Internal Medicine

## 2022-04-05 ENCOUNTER — Ambulatory Visit (INDEPENDENT_AMBULATORY_CARE_PROVIDER_SITE_OTHER): Payer: Medicare Other | Admitting: Internal Medicine

## 2022-04-05 ENCOUNTER — Encounter: Payer: Self-pay | Admitting: Internal Medicine

## 2022-04-05 ENCOUNTER — Other Ambulatory Visit: Payer: Self-pay | Admitting: Internal Medicine

## 2022-04-05 VITALS — BP 116/64 | HR 65 | Temp 98.2°F | Resp 16 | Ht 63.0 in | Wt 108.0 lb

## 2022-04-05 DIAGNOSIS — Z0000 Encounter for general adult medical examination without abnormal findings: Secondary | ICD-10-CM | POA: Diagnosis not present

## 2022-04-05 DIAGNOSIS — E039 Hypothyroidism, unspecified: Secondary | ICD-10-CM | POA: Diagnosis not present

## 2022-04-05 DIAGNOSIS — M810 Age-related osteoporosis without current pathological fracture: Secondary | ICD-10-CM | POA: Diagnosis not present

## 2022-04-05 DIAGNOSIS — E673 Hypervitaminosis D: Secondary | ICD-10-CM | POA: Diagnosis not present

## 2022-04-05 LAB — LIPID PANEL
Cholesterol: 216 mg/dL — ABNORMAL HIGH (ref 0–200)
HDL: 108.3 mg/dL (ref >39.00)
LDL Cholesterol: 101 mg/dL — ABNORMAL HIGH (ref 0–99)
NonHDL: 107.81
Total CHOL/HDL Ratio: 2
Triglycerides: 36 mg/dL (ref 0.0–149.0)
VLDL: 7.2 mg/dL (ref 0.0–40.0)

## 2022-04-05 LAB — COMPREHENSIVE METABOLIC PANEL
ALT: 15 U/L (ref 0–35)
AST: 19 U/L (ref 0–37)
Albumin: 4.4 g/dL (ref 3.5–5.2)
Alkaline Phosphatase: 81 U/L (ref 39–117)
BUN: 11 mg/dL (ref 6–23)
CO2: 32 mEq/L (ref 19–32)
Calcium: 9.2 mg/dL (ref 8.4–10.5)
Chloride: 103 mEq/L (ref 96–112)
Creatinine, Ser: 0.83 mg/dL (ref 0.40–1.20)
GFR: 73.72 mL/min (ref >60.00)
Glucose, Bld: 89 mg/dL (ref 70–99)
Potassium: 4.1 mEq/L (ref 3.5–5.1)
Sodium: 142 mEq/L (ref 135–145)
Total Bilirubin: 0.8 mg/dL (ref 0.2–1.2)
Total Protein: 6.6 g/dL (ref 6.0–8.3)

## 2022-04-05 LAB — CBC WITH DIFFERENTIAL/PLATELET
Basophils Absolute: 0 10*3/uL (ref 0.0–0.1)
Basophils Relative: 0.7 % (ref 0.0–3.0)
Eosinophils Absolute: 0.1 10*3/uL (ref 0.0–0.7)
Eosinophils Relative: 1.9 % (ref 0.0–5.0)
HCT: 42.1 % (ref 36.0–46.0)
Hemoglobin: 14.4 g/dL (ref 12.0–15.0)
Lymphocytes Relative: 32.1 % (ref 12.0–46.0)
Lymphs Abs: 1.6 10*3/uL (ref 0.7–4.0)
MCHC: 34.2 g/dL (ref 30.0–36.0)
MCV: 96.7 fl (ref 78.0–100.0)
Monocytes Absolute: 0.5 10*3/uL (ref 0.1–1.0)
Monocytes Relative: 11 % (ref 3.0–12.0)
Neutro Abs: 2.7 10*3/uL (ref 1.4–7.7)
Neutrophils Relative %: 54.3 % (ref 43.0–77.0)
Platelets: 229 10*3/uL (ref 150.0–400.0)
RBC: 4.36 Mil/uL (ref 3.87–5.11)
RDW: 14 % (ref 11.5–15.5)
WBC: 4.9 10*3/uL (ref 4.0–10.5)

## 2022-04-05 LAB — VITAMIN D 25 HYDROXY (VIT D DEFICIENCY, FRACTURES): VITD: 89.91 ng/mL (ref 30.00–100.00)

## 2022-04-05 LAB — TSH: TSH: 3.78 u[IU]/mL (ref 0.35–5.50)

## 2022-04-05 NOTE — Assessment & Plan Note (Signed)
Here for CPX Hypothyroidism: Good compliance with meds, checking labs Osteoporosis: Again declined   a bone density test, she is very active, takes calcium and vitamin D. Right LBP: report back pain, to see Ortho soon, this is going on for few months. Hypervitaminosis D: Check levels. RTC 1 year

## 2022-04-05 NOTE — Assessment & Plan Note (Signed)
-  Td 2020 - PNM 20 : declines today - shingrix:  d/w pt  - RSV : d/w pt  -COVID vaccine  booster recommended -  had a a flu shot - FH breast ca, mother dx age 65:  MMG 07-2021  ([facility]), does SBE >>>  wnl -Pap smears: Has declined consistently, see previous entries -CCS: Cscope 2009, 1 polyp ; cscope 09/2017, next 2029  per GI  -Palpable Ao-- [place] (-) AAA 2016  -labs: CMP FLP CBC TSH vitamin D -Healthcare POA D/W patient -Recommend to stay active and exercise regularly.

## 2022-04-05 NOTE — Patient Instructions (Addendum)
Vaccines I recommend:  Covid booster RSV vaccine Shingrix (shingles) Pneumonia shot     GO TO THE LAB : Get the blood work     GO TO THE FRONT DESK, [place] Come back for physical exam in 1 year      Do you have a "Living will" or "[place] of attorney"? (Advance care planning documents)  If you already have a living will or healthcare power of attorney, is recommended you bring the copy to be scanned in your chart. The document will be available to all the doctors you see in the system.  If you don't have one, please consider create one.      More information at:  [email or web address]

## 2022-04-05 NOTE — Progress Notes (Signed)
Subjective:    Patient ID: Candice Brennan, female    DOB: 08-25-1955, 66 y.o.   MRN: [id number]  DOS:  04/05/2022 Type of visit - description: CPX  Here for CPX, no major concerns.  Has a healthy lifestyle.  Review of Systems  A 14 point review of systems is negative     Past Medical History:  Diagnosis Date   Arthritis    BCC (basal cell carcinoma of skin) 07/2017   R leg   Hypothyroidism 10/10/2009   Qualifier: Diagnosis of  By: Linna Darner MD, Gwyndolyn Saxon   04/27/2009 TSH 7.66, free T4 0.86, free T3 2.5  01/02/13 Synthroid increased to 25 mcg one daily except 1-1/2 pills Tuesday and Thursday. Repeat TSH recommended 4-6 months after increase.    OSTEOPOROSIS 12/22/2007   Tremor 07/21/2013    Past Surgical History:  Procedure Laterality Date   colonoscopy with polypectomy  2009   Dr Ardis Hughs    ENDOMETRIAL ABLATION     laparoscopic   varicose vein treatment Right 05-2015   Social History   Socioeconomic History   Marital status: Married    Spouse name: Not on file   Number of children: 0   Years of education: Not on file   Highest education level: Not on file  Occupational History   Occupation: retired -- Pharmacist, community   Tobacco Use   Smoking status: Former    Types: Cigarettes    Quit date: 04/24/1983    Years since quitting: 38.9   Smokeless tobacco: Never   Tobacco comments:    smoked [place], up to 1 ppd  Substance and Sexual Activity   Alcohol use: Yes    Comment:  occasionally   Drug use: Not on file   Sexual activity: Not on file  Other Topics Concern   Not on file  Social History Narrative   Has 1 step daughter    Social Determinants of Health   Financial Resource Strain: Not on file  Food Insecurity: Not on file  Transportation Needs: Not on file  Physical Activity: Not on file  Stress: Not on file  Social Connections: Not on file  Intimate Partner Violence: Not on file    Current Outpatient Medications  Medication Instructions    Biotin 10 MG TABS 1 tablet, Oral, Daily at bedtime   Calcium Carb-Cholecalciferol (CALCIUM 500/D) 500-200 MG-UNIT TABS Oral, One in the morning and one in the evening   cholecalciferol (VITAMIN D) 1,000 Units, Oral, Daily, Reported on 07/04/2015   levothyroxine (SYNTHROID) 75 MCG tablet TAKE 1 TABLET BY MOUTH ONCE DAILY BEFORE  BREAKFAST   LUTEIN-ZEAXANTHIN PO 1 tablet, Daily   Misc Natural Products (OSTEO BI-FLEX TRIPLE STRENGTH PO) 1 tablet, Oral, 2 times daily, Osteo Bi-Flex   Multiple Vitamin (MULTIVITAMIN) tablet 1 tablet, Daily   NON FORMULARY 2 tablets, Oral, Every morning, Omega Q Plus    NON FORMULARY Co-Q10 '100mg'$  Take 2 tsp in the morning.    Omega-3 Fatty Acids (OMEGA-3 1450 PO) 2 tablets, Oral, Daily   OVER THE COUNTER MEDICATION Tumeric Curcumin 500 mg one in morning and one in Evening       Objective:   Physical Exam BP 116/64   Pulse 65   Temp 98.2 F (36.8 C) (Oral)   Resp 16   Ht '5\' 3"'$  (1.6 m)   Wt 108 lb (49 kg)   SpO2 99%   BMI 19.13 kg/m  General: Well developed, NAD, BMI noted Neck: No  thyromegaly  HEENT:  Normocephalic . Face symmetric, atraumatic Lungs:  CTA B Normal respiratory effort, no intercostal retractions, no accessory muscle use. Heart: RRR,  no murmur.  Abdomen:  Not distended, soft, non-tender. No rebound or rigidity.   Lower extremities: no pretibial edema bilaterally  Skin: Exposed areas without rash. Not pale. Not jaundice Neurologic:  alert & oriented X3.  Speech normal, gait appropriate for age and unassisted Strength symmetric and appropriate for age.  Psych: Cognition and judgment appear intact.  Cooperative with normal attention span and concentration.  Behavior appropriate. No anxious or depressed appearing.     Assessment     Assessment  hypothyroidism Osteoporosis dx ~2009 Took Fosamax from 2009-2014, patient believes it affected her jaw.  Not willing to take medications again --dexa 2013: T score  -2.4 s/p fosamax x  5 years --T score 01-2015 -1.1 -- declines DEXA as off 03-2022  Tremors  Dx  2015 Menopause +FH breast Ca (mother) [place] 07/2017, R leg  PLAN: Here for CPX Hypothyroidism: Good compliance with meds, checking labs Osteoporosis: Again declined   a bone density test, she is very active, takes calcium and vitamin D. Right LBP: report back pain, to see Ortho soon, this is going on for few months. Hypervitaminosis D: Check levels. RTC 1 year

## 2022-04-19 DIAGNOSIS — M25551 Pain in right hip: Secondary | ICD-10-CM | POA: Diagnosis not present

## 2022-04-19 DIAGNOSIS — M545 Low back pain, unspecified: Secondary | ICD-10-CM | POA: Diagnosis not present

## 2022-05-01 DIAGNOSIS — M25551 Pain in right hip: Secondary | ICD-10-CM | POA: Diagnosis not present

## 2022-05-01 DIAGNOSIS — M5459 Other low back pain: Secondary | ICD-10-CM | POA: Diagnosis not present

## 2022-05-10 DIAGNOSIS — M5459 Other low back pain: Secondary | ICD-10-CM | POA: Diagnosis not present

## 2022-05-10 DIAGNOSIS — M25551 Pain in right hip: Secondary | ICD-10-CM | POA: Diagnosis not present

## 2022-05-17 DIAGNOSIS — M25551 Pain in right hip: Secondary | ICD-10-CM | POA: Diagnosis not present

## 2022-05-17 DIAGNOSIS — M5459 Other low back pain: Secondary | ICD-10-CM | POA: Diagnosis not present

## 2022-05-18 DIAGNOSIS — Z85828 Personal history of other malignant neoplasm of skin: Secondary | ICD-10-CM | POA: Diagnosis not present

## 2022-05-18 DIAGNOSIS — D225 Melanocytic nevi of trunk: Secondary | ICD-10-CM | POA: Diagnosis not present

## 2022-05-18 DIAGNOSIS — L810 Postinflammatory hyperpigmentation: Secondary | ICD-10-CM | POA: Diagnosis not present

## 2022-05-18 DIAGNOSIS — L820 Inflamed seborrheic keratosis: Secondary | ICD-10-CM | POA: Diagnosis not present

## 2022-05-18 DIAGNOSIS — L821 Other seborrheic keratosis: Secondary | ICD-10-CM | POA: Diagnosis not present

## 2022-05-18 DIAGNOSIS — L280 Lichen simplex chronicus: Secondary | ICD-10-CM | POA: Diagnosis not present

## 2022-05-18 DIAGNOSIS — Z08 Encounter for follow-up examination after completed treatment for malignant neoplasm: Secondary | ICD-10-CM | POA: Diagnosis not present

## 2022-05-29 ENCOUNTER — Ambulatory Visit: Payer: Medicare Other

## 2022-06-08 ENCOUNTER — Ambulatory Visit (INDEPENDENT_AMBULATORY_CARE_PROVIDER_SITE_OTHER): Payer: Medicare Other | Admitting: *Deleted

## 2022-06-08 DIAGNOSIS — Z0000 Encounter for general adult medical examination without abnormal findings: Secondary | ICD-10-CM | POA: Diagnosis not present

## 2022-06-08 NOTE — Progress Notes (Signed)
Subjective:   Candice Brennan is a 67 y.o. female who presents for an Initial Medicare Annual Wellness Visit.  I connected with  Candice Brennan on 06/08/22 by a audio enabled telemedicine application and verified that I am speaking with the correct person using two identifiers.  Patient Location: Home  Provider Location: Office/Clinic  I discussed the limitations of evaluation and management by telemedicine. The patient expressed understanding and agreed to proceed.   Review of Systems    Defer to PCP Cardiac Risk Factors include: advanced age (>95mn, >>68women)     Objective:    There were no vitals filed for this visit. There is no height or weight on file to calculate BMI.     06/08/2022    1:40 PM  Advanced Directives  Does Patient Have a Medical Advance Directive? Yes  Type of AParamed[place]of [place] will  Does patient want to make changes to medical advance directive? No - Patient declined  Copy of [place] Chart? No - copy requested    Current Medications (verified) Outpatient Encounter Medications as of 06/08/2022  Medication Sig   Biotin 10 MG TABS Take 1 tablet by mouth at bedtime.   Calcium Carb-Cholecalciferol (CALCIUM 500/D) 500-200 MG-UNIT TABS Take by mouth. One in the morning and one in the evening   cholecalciferol (VITAMIN D) 1000 UNITS tablet Take 1,000 Units by mouth daily. Reported on 07/04/2015   levothyroxine (SYNTHROID) 75 MCG tablet Take 1 tablet (75 mcg total) by mouth daily before breakfast.   LUTEIN-ZEAXANTHIN PO Take 1 tablet by mouth daily.    Misc Natural Products (OSTEO BI-FLEX TRIPLE STRENGTH PO) Take 1 tablet by mouth 2 (two) times daily. Osteo Bi-Flex   Multiple Vitamin (MULTIVITAMIN) tablet Take 1 tablet by mouth daily.   NON FORMULARY Take 2 tablets by mouth every morning. Omega Q Plus   NON FORMULARY Co-Q10 103mTake 2 tsp in the morning.   Omega-3 Fatty Acids (OMEGA-3  1450 PO) Take 2 tablets by mouth daily.   OVER THE COUNTER MEDICATION Tumeric Curcumin 500 mg one in morning and one in Evening   No facility-administered encounter medications on file as of 06/08/2022.    Allergies (verified) Penicillins and Fosamax [alendronate sodium]   History: Past Medical History:  Diagnosis Date   Arthritis    BCC (basal cell carcinoma of skin) 07/2017   R leg   Hypothyroidism 10/10/2009   Qualifier: Diagnosis of  By: HoLinna DarnerD, WiGwyndolyn Saxon 04/27/2009 TSH 7.66, free T4 0.86, free T3 2.5  01/02/13 Synthroid increased to 25 mcg one daily except 1-1/2 pills Tuesday and Thursday. Repeat TSH recommended 4-6 months after increase.    OSTEOPOROSIS 12/22/2007   Tremor 07/21/2013   Past Surgical History:  Procedure Laterality Date   colonoscopy with polypectomy  2009   Dr JaArdis Hughs  ENDOMETRIAL ABLATION     laparoscopic   varicose vein treatment Right 05-2015   Family History  Problem Relation Age of Onset   Breast cancer Mother        died 1129-Nov-2021 Osteoporosis Mother    Colon cancer Maternal Aunt    Heart disease Maternal Uncle        MI ? age   Lung cancer Maternal Uncle    Stroke Maternal Grandmother        in 8057s Stroke Maternal Grandfather        in 8088s Osteoporosis  Maternal Aunt         X 2   Diabetes Neg Hx    Liver cancer Neg Hx    Pancreatic cancer Neg Hx    Rectal cancer Neg Hx    Stomach cancer Neg Hx    Social History   Socioeconomic History   Marital status: Married    Spouse name: Not on file   Number of children: 0   Years of education: Not on file   Highest education level: Not on file  Occupational History   Occupation: retired -- Pharmacist, community   Tobacco Use   Smoking status: Former    Types: Cigarettes    Quit date: 04/24/1983    Years since quitting: 39.1   Smokeless tobacco: Never   Tobacco comments:    smoked [place], up to 1 ppd  Substance and Sexual Activity   Alcohol use: Yes    Comment:  occasionally    Drug use: Not on file   Sexual activity: Not on file  Other Topics Concern   Not on file  Social History Narrative   Has 1 step daughter    Social Determinants of Health   Financial Resource Strain: Low Risk  (06/08/2022)   Overall Financial Resource Strain (CARDIA)    Difficulty of Paying Living Expenses: Not hard at all  Food Insecurity: No Food Insecurity (06/08/2022)   Hunger Vital Sign    Worried About Running Out of Food in the Last Year: Never true    [place] in the Last Year: Never true  Transportation Needs: No Transportation Needs (06/08/2022)   PRAPARE - Hydrologist (Medical): No    Lack of Transportation (Non-Medical): No  Physical Activity: Sufficiently Active (06/08/2022)   Exercise Vital Sign    Days of Exercise per Week: 7 days    Minutes of Exercise per Session: 30 min  Stress: No Stress Concern Present (06/08/2022)   [place]    Feeling of Stress : Not at all  Social Connections: Moderately Isolated (06/08/2022)   Social Connection and Isolation Panel [NHANES]    Frequency of Communication with Friends and Family: More than three times a week    Frequency of Social Gatherings with Friends and Family: More than three times a week    Attends Religious Services: Never    Marine scientist or Organizations: No    Attends Archivist Meetings: Never    Marital Status: Married    Tobacco Counseling Counseling given: Not Answered Tobacco comments: smoked [place], up to 1 ppd   Clinical Intake:  Pre-visit preparation completed: Yes  Pain : No/denies pain  Diabetes: No  How often do you need to have someone help you when you read instructions, pamphlets, or other written materials from your doctor or pharmacy?: 1 - Never   Activities of Daily Living    06/08/2022    1:46 PM  In your present state of health, do you have any difficulty  performing the following activities:  Hearing? 0  Vision? 0  Difficulty concentrating or making decisions? 0  Walking or climbing stairs? 0  Dressing or bathing? 0  Doing errands, shopping? 0  Preparing Food and eating ? N  Using the Toilet? N  In the past six months, have you accidently leaked urine? N  Do you have problems with loss of bowel control? N  Managing your Medications? N  Managing your Finances? N  Housekeeping or managing your Housekeeping? N    Patient Care Team: Colon Branch, MD as PCP - General (Internal Medicine) Edman Circle (Gynecology) Alda Berthold, DO as Consulting Physician (Neurology) Dorothy Spark, MD as Consulting Physician (Cardiology) Druscilla Brownie, MD as Consulting Physician (Dermatology) Linus Mako, MD as Consulting Physician (Family Medicine)  Indicate any recent Medical Services you may have received from other than [facility] providers in the past year (date may be approximate).     Assessment:   This is a routine wellness examination for Candice Brennan.  Hearing/Vision screen No results found.  Dietary issues and exercise activities discussed: Current Exercise Habits: Home exercise routine, Type of exercise: walking;strength training/weights, Time (Minutes): 30, Frequency (Times/Week): 7, Weekly Exercise (Minutes/Week): 210, Intensity: Moderate, Exercise limited by: None identified   Goals Addressed   None    Depression Screen    06/08/2022    1:46 PM 04/05/2022   10:05 AM 04/06/2021   10:00 AM 02/03/2019    1:38 PM 01/21/2018    9:32 AM 01/15/2017    9:38 AM 01/12/2016    9:30 AM  PHQ 2/9 Scores  PHQ - 2 Score 0 0 0 0 0 0 0    Fall Risk    06/08/2022    1:40 PM 04/05/2022   10:06 AM 04/06/2021   10:00 AM 01/21/2018    9:32 AM 01/15/2017    9:38 AM  Fall Risk   Falls in the past year? 0 0 0 No No  Number falls in past yr: 0 0 0    Injury with Fall? 0 0 0    Risk for fall due to : No Fall Risks      Follow up Falls  evaluation completed Falls evaluation completed Falls evaluation completed      [place]:  Any stairs in or around the home? Yes  If so, are there any without handrails? No  Home free of loose throw rugs in walkways, pet beds, electrical cords, etc? Yes  Adequate lighting in your home to reduce risk of falls? Yes   ASSISTIVE DEVICES UTILIZED TO PREVENT FALLS:  Life alert? No  Use of a cane, walker or w/c? No  Grab bars in the bathroom? No  Shower chair or bench in shower? No  Elevated toilet seat or a handicapped toilet? No   TIMED UP AND GO:  Was the test performed?  No, audio visit .    Cognitive Function:        06/08/2022    1:55 PM  6CIT Screen  What Year? 0 points  What month? 0 points  What time? 0 points  Count back from 20 0 points  Months in reverse 0 points  Repeat phrase 0 points  Total Score 0 points    Immunizations Immunization History  Administered Date(s) Administered   Fluad Quad(high Dose 65+) 02/15/2022   Influenza Split 02/01/2011, 02/05/2012   Influenza Whole 01/09/2010   Influenza,inj,Quad PF,6+ Mos 02/05/2013, 01/05/2014, 01/10/2015, 01/12/2016, 01/15/2017, 01/21/2018, 01/15/2019, 04/04/2020, 02/09/2021   PFIZER(Purple Top)SARS-COV-2 Vaccination 09/24/2019, 10/16/2019, 10/10/2020   Td 09/21/2008   Tdap 02/06/2019    TDAP status: Up to date  Flu Vaccine status: Up to date  Pneumococcal vaccine status: Due, Education has been provided regarding the importance of this vaccine. Advised may receive this vaccine at local pharmacy or Health Dept. Aware to provide a copy of the vaccination record if obtained from local pharmacy or  Health Dept. Verbalized acceptance and understanding.  Covid-19 vaccine status: Information provided on how to obtain vaccines.   Qualifies for Shingles Vaccine? Yes   Zostavax completed No   Shingrix Completed?: No.    Education has been provided regarding the importance of this  vaccine. Patient has been advised to call insurance company to determine out of pocket expense if they have not yet received this vaccine. Advised may also receive vaccine at local pharmacy or Health Dept. Verbalized acceptance and understanding.  Screening Tests Health Maintenance  Topic Date Due   Medicare Annual Wellness (AWV)  Never done   Zoster Vaccines- Shingrix (1 of 2) Never done   COVID-19 Vaccine (4 - 2023-24 season) 12/22/2021   Pneumonia Vaccine 68+ Years old (1 of 1 - PCV) 04/06/2023 (Originally 05/02/2021)   MAMMOGRAM  07/25/2022   COLONOSCOPY (Pts 45-27yr Insurance coverage will need to be confirmed)  10/16/2027   DTaP/Tdap/Td (3 - Td or Tdap) 02/05/2029   INFLUENZA VACCINE  Completed   DEXA SCAN  Completed   Hepatitis C Screening  Completed   HPV VACCINES  Aged Out    Health Maintenance  Health Maintenance Due  Topic Date Due   Medicare Annual Wellness (AWV)  Never done   Zoster Vaccines- Shingrix (1 of 2) Never done   COVID-19 Vaccine (4 - 2023-24 season) 12/22/2021    Colorectal cancer screening: Type of screening: Colonoscopy. Completed 10/15/17. Repeat every 10 years  Mammogram status: Completed 07/24/21. Repeat every year  Bone Density status: pt declined.  Lung Cancer Screening: (Low Dose CT Chest recommended if Age 66-80years, 30 pack-year currently smoking OR have quit w/in 15years.) does not qualify.   Additional Screening:  Hepatitis C Screening: does qualify; Completed 01/10/15  Vision Screening: Recommended annual ophthalmology exams for early detection of glaucoma and other disorders of the eye. Is the patient up to date with their annual eye exam?  Yes  Who is the provider or what is the name of the office in which the patient attends annual eye exams? [facility] GEulas PostIf pt is not established with a provider, would they like to be referred to a provider to establish care? No .   Dental Screening: Recommended annual dental exams for  proper oral hygiene  Community Resource Referral / Chronic Care Management: CRR required this visit?  No   CCM required this visit?  No      Plan:     I have personally reviewed and noted the following in the patient's chart:   Medical and social history Use of alcohol, tobacco or illicit drugs  Current medications and supplements including opioid prescriptions. Patient is not currently taking opioid prescriptions. Functional ability and status Nutritional status Physical activity Advanced directives List of other physicians Hospitalizations, surgeries, and ER visits in previous 12 months Vitals Screenings to include cognitive, depression, and falls Referrals and appointments  In addition, I have reviewed and discussed with patient certain preventive protocols, quality metrics, and best practice recommendations. A written personalized care plan for preventive services as well as general preventive health recommendations were provided to patient.   Due to this being a telephonic visit, the after visit summary with patients personalized plan was offered to patient via mail or my-chart. Per request, patient was mailed a copy of AVS.   BBeatris Ship CEl Nido  06/08/2022   Nurse Notes: None

## 2022-06-08 NOTE — Progress Notes (Addendum)
I have reviewed and agree with [facility] documentation.  Kathlene November, MD

## 2022-06-08 NOTE — Patient Instructions (Signed)
Ms. Candice Brennan , Thank you for taking time to come for your Medicare Wellness Visit. I appreciate your ongoing commitment to your health goals. Please review the following plan we discussed and let me know if I can assist you in the future.   These are the goals we discussed:  Goals   None     This is a list of the screening recommended for you and due dates:  Health Maintenance  Topic Date Due   Zoster (Shingles) Vaccine (1 of 2) Never done   COVID-19 Vaccine (4 - 2023-24 season) 12/22/2021   Pneumonia Vaccine (1 of 1 - PCV) 04/06/2023*   Mammogram  07/25/2022   Medicare Annual Wellness Visit  06/09/2023   Colon Cancer Screening  10/16/2027   DTaP/Tdap/Td vaccine (3 - Td or Tdap) 02/05/2029   Flu Shot  Completed   DEXA scan (bone density measurement)  Completed   Hepatitis C Screening: USPSTF Recommendation to screen - Ages 29-79 yo.  Completed   HPV Vaccine  Aged Out  *Topic was postponed. The date shown is not the original due date.     Next appointment: Follow up in one year for your annual wellness visit.   Preventive Care 66 Years and Older, Female Preventive care refers to lifestyle choices and visits with your health care provider that can promote health and wellness. What does preventive care include? A yearly physical exam. This is also called an annual well check. Dental exams once or twice a year. Routine eye exams. Ask your health care provider how often you should have your eyes checked. Personal lifestyle choices, including: Daily care of your teeth and gums. Regular physical activity. Eating a healthy diet. Avoiding tobacco and drug use. Limiting alcohol use. Practicing safe sex. Taking low-dose aspirin every day. Taking vitamin and mineral supplements as recommended by your health care provider. What happens during an annual well check? The services and screenings done by your health care provider during your annual well check will depend on your age,  overall health, lifestyle risk factors, and family history of disease. Counseling  Your health care provider may ask you questions about your: Alcohol use. Tobacco use. Drug use. Emotional well-being. Home and relationship well-being. Sexual activity. Eating habits. History of falls. Memory and ability to understand (cognition). Work and work Statistician. Reproductive health. Screening  You may have the following tests or measurements: Height, weight, and BMI. Blood pressure. Lipid and cholesterol levels. These may be checked every 5 years, or more frequently if you are over 76 years old. Skin check. Lung cancer screening. You may have this screening every year starting at age 66 if you have a 30-pack-year history of smoking and currently smoke or have quit within the past 15 years. Fecal occult blood test (FOBT) of the stool. You may have this test every year starting at age 66. Flexible sigmoidoscopy or colonoscopy. You may have a sigmoidoscopy every 5 years or a colonoscopy every 10 years starting at age 66. Hepatitis C blood test. Hepatitis B blood test. Sexually transmitted disease (STD) testing. Diabetes screening. This is done by checking your blood sugar (glucose) after you have not eaten for a while (fasting). You may have this done every 1-3 years. Bone density scan. This is done to screen for osteoporosis. You may have this done starting at age 66. Mammogram. This may be done every 1-2 years. Talk to your health care provider about how often you should have regular mammograms. Talk with your health care  provider about your test results, treatment options, and if necessary, the need for more tests. Vaccines  Your health care provider may recommend certain vaccines, such as: Influenza vaccine. This is recommended every year. Tetanus, diphtheria, and acellular pertussis (Tdap, Td) vaccine. You may need a Td booster every 10 years. Zoster vaccine. You may need this after age  82. Pneumococcal 13-valent conjugate (PCV13) vaccine. One dose is recommended after age 79. Pneumococcal polysaccharide (PPSV23) vaccine. One dose is recommended after age 66. Talk to your health care provider about which screenings and vaccines you need and how often you need them. This information is not intended to replace advice given to you by your health care provider. Make sure you discuss any questions you have with your health care provider. Document Released: 05/06/2015 Document Revised: 12/28/2015 Document Reviewed: 02/08/2015 Elsevier Interactive Patient Education  2017 [place] Prevention in the Home Falls can cause injuries. They can happen to people of all ages. There are many things you can do to make your home safe and to help prevent falls. What can I do on the outside of my home? Regularly fix the edges of walkways and driveways and fix any cracks. Remove anything that might make you trip as you walk through a door, such as a raised step or threshold. Trim any bushes or trees on the path to your home. Use bright outdoor lighting. Clear any walking paths of anything that might make someone trip, such as rocks or tools. Regularly check to see if handrails are loose or broken. Make sure that both sides of any steps have handrails. Any raised decks and porches should have guardrails on the edges. Have any leaves, snow, or ice cleared regularly. Use sand or salt on walking paths during winter. Clean up any spills in your garage right away. This includes oil or grease spills. What can I do in the bathroom? Use night lights. Install grab bars by the toilet and in the tub and shower. Do not use towel bars as grab bars. Use non-skid mats or decals in the tub or shower. If you need to sit down in the shower, use a plastic, non-slip stool. Keep the floor dry. Clean up any water that spills on the floor as soon as it happens. Remove soap buildup in the tub or shower  regularly. Attach bath mats securely with double-sided non-slip rug tape. Do not have throw rugs and other things on the floor that can make you trip. What can I do in the bedroom? Use night lights. Make sure that you have a light by your bed that is easy to reach. Do not use any sheets or blankets that are too big for your bed. They should not hang down onto the floor. Have a firm chair that has side arms. You can use this for support while you get dressed. Do not have throw rugs and other things on the floor that can make you trip. What can I do in the kitchen? Clean up any spills right away. Avoid walking on wet floors. Keep items that you use a lot in easy-to-reach places. If you need to reach something above you, use a strong step stool that has a grab bar. Keep electrical cords out of the way. Do not use floor polish or wax that makes floors slippery. If you must use wax, use non-skid floor wax. Do not have throw rugs and other things on the floor that can make you trip. What can I  do with my stairs? Do not leave any items on the stairs. Make sure that there are handrails on both sides of the stairs and use them. Fix handrails that are broken or loose. Make sure that handrails are as long as the stairways. Check any carpeting to make sure that it is firmly attached to the stairs. Fix any carpet that is loose or worn. Avoid having throw rugs at the top or bottom of the stairs. If you do have throw rugs, attach them to the floor with carpet tape. Make sure that you have a light switch at the top of the stairs and the bottom of the stairs. If you do not have them, ask someone to add them for you. What else can I do to help prevent falls? Wear shoes that: Do not have high heels. Have rubber bottoms. Are comfortable and fit you well. Are closed at the toe. Do not wear sandals. If you use a stepladder: Make sure that it is fully opened. Do not climb a closed stepladder. Make sure that  both sides of the stepladder are locked into place. Ask someone to hold it for you, if possible. Clearly mark and make sure that you can see: Any grab bars or handrails. First and last steps. Where the edge of each step is. Use tools that help you move around (mobility aids) if they are needed. These include: Canes. Walkers. Scooters. Crutches. Turn on the lights when you go into a dark area. Replace any light bulbs as soon as they burn out. Set up your furniture so you have a clear path. Avoid moving your furniture around. If any of your floors are uneven, fix them. If there are any pets around you, be aware of where they are. Review your medicines with your doctor. Some medicines can make you feel dizzy. This can increase your chance of falling. Ask your doctor what other things that you can do to help prevent falls. This information is not intended to replace advice given to you by your health care provider. Make sure you discuss any questions you have with your health care provider. Document Released: 02/03/2009 Document Revised: 09/15/2015 Document Reviewed: 05/14/2014 Elsevier Interactive Patient Education  2017 [employer].

## 2022-06-19 ENCOUNTER — Other Ambulatory Visit: Payer: Self-pay | Admitting: Internal Medicine

## 2022-06-19 DIAGNOSIS — Z1231 Encounter for screening mammogram for malignant neoplasm of breast: Secondary | ICD-10-CM

## 2022-08-03 ENCOUNTER — Encounter: Payer: Self-pay | Admitting: Internal Medicine

## 2022-08-06 ENCOUNTER — Ambulatory Visit
Admission: RE | Admit: 2022-08-06 | Discharge: 2022-08-06 | Disposition: A | Discharge status: Home / Self Care | Payer: Medicare Other | Source: Ambulatory Visit | Attending: Internal Medicine | Admitting: Internal Medicine

## 2022-08-06 DIAGNOSIS — Z1231 Encounter for screening mammogram for malignant neoplasm of breast: Secondary | ICD-10-CM

## 2022-11-05 DIAGNOSIS — D3131 Benign neoplasm of right choroid: Secondary | ICD-10-CM | POA: Diagnosis not present

## 2022-11-05 DIAGNOSIS — H31091 Other chorioretinal scars, right eye: Secondary | ICD-10-CM | POA: Diagnosis not present

## 2022-11-05 DIAGNOSIS — H2513 Age-related nuclear cataract, bilateral: Secondary | ICD-10-CM | POA: Diagnosis not present

## 2022-11-05 DIAGNOSIS — H43811 Vitreous degeneration, right eye: Secondary | ICD-10-CM | POA: Diagnosis not present

## 2022-12-11 DIAGNOSIS — H43813 Vitreous degeneration, bilateral: Secondary | ICD-10-CM | POA: Diagnosis not present

## 2022-12-11 DIAGNOSIS — H59811 Chorioretinal scars after surgery for detachment, right eye: Secondary | ICD-10-CM | POA: Diagnosis not present

## 2022-12-11 DIAGNOSIS — H35371 Puckering of macula, right eye: Secondary | ICD-10-CM | POA: Diagnosis not present

## 2022-12-11 DIAGNOSIS — H2513 Age-related nuclear cataract, bilateral: Secondary | ICD-10-CM | POA: Diagnosis not present

## 2023-01-21 DIAGNOSIS — H43813 Vitreous degeneration, bilateral: Secondary | ICD-10-CM | POA: Diagnosis not present

## 2023-01-21 DIAGNOSIS — H2513 Age-related nuclear cataract, bilateral: Secondary | ICD-10-CM | POA: Diagnosis not present

## 2023-01-21 DIAGNOSIS — H59811 Chorioretinal scars after surgery for detachment, right eye: Secondary | ICD-10-CM | POA: Diagnosis not present

## 2023-01-21 DIAGNOSIS — H35371 Puckering of macula, right eye: Secondary | ICD-10-CM | POA: Diagnosis not present

## 2023-02-05 ENCOUNTER — Ambulatory Visit (INDEPENDENT_AMBULATORY_CARE_PROVIDER_SITE_OTHER): Payer: Medicare Other

## 2023-02-05 DIAGNOSIS — Z23 Encounter for immunization: Secondary | ICD-10-CM

## 2023-03-29 ENCOUNTER — Other Ambulatory Visit: Payer: Self-pay | Admitting: Internal Medicine

## 2023-05-06 DIAGNOSIS — H2513 Age-related nuclear cataract, bilateral: Secondary | ICD-10-CM | POA: Diagnosis not present

## 2023-05-06 DIAGNOSIS — H43813 Vitreous degeneration, bilateral: Secondary | ICD-10-CM | POA: Diagnosis not present

## 2023-05-06 DIAGNOSIS — H59811 Chorioretinal scars after surgery for detachment, right eye: Secondary | ICD-10-CM | POA: Diagnosis not present

## 2023-05-06 DIAGNOSIS — H35371 Puckering of macula, right eye: Secondary | ICD-10-CM | POA: Diagnosis not present

## 2023-05-14 ENCOUNTER — Ambulatory Visit (INDEPENDENT_AMBULATORY_CARE_PROVIDER_SITE_OTHER): Payer: Medicare Other | Admitting: Internal Medicine

## 2023-05-14 ENCOUNTER — Encounter: Payer: Self-pay | Admitting: Internal Medicine

## 2023-05-14 VITALS — BP 116/70 | HR 62 | Temp 98.1°F | Resp 16 | Ht 63.0 in | Wt 111.2 lb

## 2023-05-14 DIAGNOSIS — E039 Hypothyroidism, unspecified: Secondary | ICD-10-CM | POA: Diagnosis not present

## 2023-05-14 DIAGNOSIS — Z0000 Encounter for general adult medical examination without abnormal findings: Secondary | ICD-10-CM | POA: Diagnosis not present

## 2023-05-14 DIAGNOSIS — M810 Age-related osteoporosis without current pathological fracture: Secondary | ICD-10-CM

## 2023-05-14 DIAGNOSIS — E673 Hypervitaminosis D: Secondary | ICD-10-CM

## 2023-05-14 DIAGNOSIS — Z0001 Encounter for general adult medical examination with abnormal findings: Secondary | ICD-10-CM

## 2023-05-14 LAB — LIPID PANEL
Cholesterol: 236 mg/dL — ABNORMAL HIGH (ref 0–200)
HDL: 100.8 mg/dL (ref >39.00)
LDL Cholesterol: 125 mg/dL — ABNORMAL HIGH (ref 0–99)
NonHDL: 134.88
Total CHOL/HDL Ratio: 2
Triglycerides: 49 mg/dL (ref 0.0–149.0)
VLDL: 9.8 mg/dL (ref 0.0–40.0)

## 2023-05-14 LAB — CBC WITH DIFFERENTIAL/PLATELET
Basophils Absolute: 0 10*3/uL (ref 0.0–0.1)
Basophils Relative: 0.5 % (ref 0.0–3.0)
Eosinophils Absolute: 0.1 10*3/uL (ref 0.0–0.7)
Eosinophils Relative: 2.4 % (ref 0.0–5.0)
HCT: 42.9 % (ref 36.0–46.0)
Hemoglobin: 14.2 g/dL (ref 12.0–15.0)
Lymphocytes Relative: 25.1 % (ref 12.0–46.0)
Lymphs Abs: 1.5 10*3/uL (ref 0.7–4.0)
MCHC: 33.1 g/dL (ref 30.0–36.0)
MCV: 97.7 fL (ref 78.0–100.0)
Monocytes Absolute: 0.6 10*3/uL (ref 0.1–1.0)
Monocytes Relative: 10.2 % (ref 3.0–12.0)
Neutro Abs: 3.6 10*3/uL (ref 1.4–7.7)
Neutrophils Relative %: 61.8 % (ref 43.0–77.0)
Platelets: 234 10*3/uL (ref 150.0–400.0)
RBC: 4.39 Mil/uL (ref 3.87–5.11)
RDW: 13.6 % (ref 11.5–15.5)
WBC: 5.9 10*3/uL (ref 4.0–10.5)

## 2023-05-14 LAB — COMPREHENSIVE METABOLIC PANEL
ALT: 14 U/L (ref 0–35)
AST: 18 U/L (ref 0–37)
Albumin: 4.4 g/dL (ref 3.5–5.2)
Alkaline Phosphatase: 67 U/L (ref 39–117)
BUN: 12 mg/dL (ref 6–23)
CO2: 30 meq/L (ref 19–32)
Calcium: 9 mg/dL (ref 8.4–10.5)
Chloride: 102 meq/L (ref 96–112)
Creatinine, Ser: 0.72 mg/dL (ref 0.40–1.20)
GFR: 86.76 mL/min (ref >60.00)
Glucose, Bld: 87 mg/dL (ref 70–99)
Potassium: 3.9 meq/L (ref 3.5–5.1)
Sodium: 141 meq/L (ref 135–145)
Total Bilirubin: 0.7 mg/dL (ref 0.2–1.2)
Total Protein: 6.6 g/dL (ref 6.0–8.3)

## 2023-05-14 NOTE — Progress Notes (Unsigned)
Subjective:    Patient ID: Candice Brennan, female    DOB: 02-27-1956, 68 y.o.   MRN: [id number]  DOS:  05/14/2023 Type of visit - description: Here for CPX, here with her husband  In general doing well. 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   Review of Systems See above   Past Medical History:  Diagnosis Date   Arthritis    BCC (basal cell carcinoma of skin) 07/2017   R leg   Hypothyroidism 10/10/2009   Qualifier: Diagnosis of  By: Alwyn Ren MD, Chrissie Noa   04/27/2009 TSH 7.66, free T4 0.86, free T3 2.5  01/02/13 Synthroid increased to 25 mcg one daily except 1-1/2 pills Tuesday and Thursday. Repeat TSH recommended 4-6 months after increase.    OSTEOPOROSIS 12/22/2007   Tremor 07/21/2013    Past Surgical History:  Procedure Laterality Date   colonoscopy with polypectomy  2009   Dr Christella Hartigan    ENDOMETRIAL ABLATION     laparoscopic   varicose vein treatment Right 05-2015    Current Outpatient Medications  Medication Instructions   Biotin 10 MG TABS 1 tablet, Daily at bedtime   Calcium Carb-Cholecalciferol (CALCIUM 500/D) 500-200 MG-UNIT TABS Take by mouth. One in the morning and one in the evening   cholecalciferol (VITAMIN D) 1,000 Units, Daily   levothyroxine (SYNTHROID) 75 mcg, Oral, Daily before breakfast   LUTEIN-ZEAXANTHIN PO 1 tablet, Daily   Misc Natural Products (OSTEO BI-FLEX TRIPLE STRENGTH PO) 1 tablet, 2 times daily   Multiple Vitamin (MULTIVITAMIN) tablet 1 tablet, Daily   NON FORMULARY 2 tablets, Every morning   NON FORMULARY Co-Q10 100mg  Take 2 tsp in the morning.   Omega-3 Fatty Acids (OMEGA-3 1450 PO) 2 tablets, Daily   OVER THE COUNTER MEDICATION Tumeric Curcumin 500 mg one in morning and one in Evening       Objective:   Physical Exam BP 116/70   Pulse 62   Temp 98.1 F (36.7 C) (Oral)   Resp 16   Ht 5\' 3"  (1.6 m)   Wt 111 lb 4 oz (50.5 kg)   SpO2 98%   BMI 19.71 kg/m  General: Well developed, NAD,  BMI noted Neck: No  thyromegaly  HEENT:  Normocephalic . Face symmetric, atraumatic Lungs:  CTA B Normal respiratory effort, no intercostal retractions, no accessory muscle use. Heart: RRR,  no murmur.  Abdomen:  Not distended, soft, non-tender. No rebound or rigidity.   Lower extremities: no pretibial edema bilaterally  Skin: Exposed areas without rash. Not pale. Not jaundice Neurologic:  alert & oriented X3.  Speech normal, gait appropriate for age and unassisted Strength symmetric and appropriate for age.  Psych: Cognition and judgment appear intact.  Cooperative with normal attention span and concentration.  Behavior appropriate. No anxious or depressed appearing.     Assessment     Assessment  hypothyroidism Osteoporosis dx ~2009 Took Fosamax from 2009-2014, patient believes it affected her jaw.  Not willing to take medications again --dexa 2013: T score  -2.4 s/p fosamax x 5 years --T score 01-2015 -1.1 -- declines DEXA as off 04/2023 b/c she won't take meds  Tremors  Dx  2015 Menopause +FH breast Ca (mother) BCC 07/2017, R leg  PLAN: Here for CPX -Td 2020 -  Had a a flu shot - Vaccines are recommended: PNM 20, Shingrix,  COVID booster. D/w pt pros>>cons   - FH breast ca, mother dx age 76:  MMG 07-2022  (  [facility]), recommend to continue with SBE   -Pap smears: Has declined consistently, see previous entries -CCS: Cscope 2009, 1 polyp ; cscope 09/2017, next 2029  per GI  -Palpable Ao-- [place] (-) AAA 2016  -labs: CMP FLP CBC TSH vitamin D -Healthcare POA: Information provided. - Lifestyle is very good, she remains active weather permits.  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  Offered a referral to a specialist for another opinion. RTC 1  year == Hypothyroidism: Good compliance with meds, checking labs Osteoporosis: Again declined   a bone density test, she is very active, takes calcium and vitamin D. Right LBP: report back pain, to see Ortho soon, this is going on for few months. Hypervitaminosis D: Check levels. RTC 1 year

## 2023-05-14 NOTE — Patient Instructions (Addendum)
Vaccines I recommend: Covid booster Shingrix (shingles) Prevnar 20      GO TO THE LAB : Get the blood work     Next visit with me in 1 year.  Sooner if needed     Please schedule it at the front desk       "Health Care Power of attorney" ,  "Living will" (Advance care planning documents)  If you already have a living will or healthcare power of attorney, is recommended you bring the copy to be scanned in your chart.   The document will be available to all the doctors you see in the system.  Advance care planning is a process that supports adults in  understanding and sharing their preferences regarding future medical care.  The patient's preferences are recorded in documents called Advance Directives and the can be modified at any time while the patient is in full mental capacity.   If you don't have one, please consider create one.      More information at: [email or web address]

## 2023-05-15 ENCOUNTER — Encounter: Payer: Self-pay | Admitting: Internal Medicine

## 2023-05-15 LAB — TSH: TSH: 2.43 u[IU]/mL (ref 0.35–5.50)

## 2023-05-15 LAB — VITAMIN D 25 HYDROXY (VIT D DEFICIENCY, FRACTURES): VITD: 77.37 ng/mL (ref 30.00–100.00)

## 2023-05-15 NOTE — Assessment & Plan Note (Signed)
Here for CPX -Td 2020 -  Had a a flu shot - Vaccines are recommended: PNM 20, Shingrix,  COVID booster. D/w pt pros>>cons  - FH breast ca, mother dx age 67:  MMG 07-2022  ([facility]), rec to cont  SBE   -Pap smears: Has declined consistently, see previous entries -CCS: Cscope 2009, 1 polyp ; cscope 09/2017, next 2029  per GI  -Palpable Ao-- [place] (-) AAA 2016  -labs: CMP FLP CBC TSH vitamin D -Healthcare POA: Information provided. - Lifestyle is very good, she remains active weather permits.

## 2023-05-15 NOTE — Assessment & Plan Note (Signed)
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  Offered a referral to a specialist for another opinion. Declined  RTC 1 year

## 2023-05-23 ENCOUNTER — Telehealth: Payer: Self-pay | Admitting: Internal Medicine

## 2023-05-23 DIAGNOSIS — L814 Other melanin hyperpigmentation: Secondary | ICD-10-CM | POA: Diagnosis not present

## 2023-05-23 DIAGNOSIS — D2362 Other benign neoplasm of skin of left upper limb, including shoulder: Secondary | ICD-10-CM | POA: Diagnosis not present

## 2023-05-23 DIAGNOSIS — I788 Other diseases of capillaries: Secondary | ICD-10-CM | POA: Diagnosis not present

## 2023-05-23 DIAGNOSIS — L821 Other seborrheic keratosis: Secondary | ICD-10-CM | POA: Diagnosis not present

## 2023-05-23 DIAGNOSIS — L280 Lichen simplex chronicus: Secondary | ICD-10-CM | POA: Diagnosis not present

## 2023-05-23 NOTE — Telephone Encounter (Signed)
Copied from CRM [id number]. Topic: Medicare AWV >> May 23, 2023 11:38 AM Payton Doughty wrote: Reason for CRM: Called LVM 05/23/2023 to schedule AWV. Please schedule Virtual or Telehealth visits ONLY.   Verlee Rossetti; Care Guide Ambulatory Clinical Support [place] [facility] Direct Dial: [phone]

## 2023-05-30 ENCOUNTER — Ambulatory Visit (INDEPENDENT_AMBULATORY_CARE_PROVIDER_SITE_OTHER): Payer: Medicare Other

## 2023-05-30 VITALS — Ht 63.0 in | Wt 111.0 lb

## 2023-05-30 DIAGNOSIS — Z0000 Encounter for general adult medical examination without abnormal findings: Secondary | ICD-10-CM | POA: Diagnosis not present

## 2023-05-30 NOTE — Progress Notes (Signed)
 Subjective:   Candice Brennan is a 68 y.o. female who presents for Medicare Annual (Subsequent) preventive examination.  Visit Complete: Virtual I connected with  Winna M Jaimes on 05/30/23 by a audio enabled telemedicine application and verified that I am speaking with the correct person using two identifiers.  Patient Location: Home  Provider Location: Home Office  I discussed the limitations of evaluation and management by telemedicine. The patient expressed understanding and agreed to proceed.  Vital Signs: Because this visit was a virtual/telehealth visit, some criteria may be missing or patient reported. Any vitals not documented were not able to be obtained and vitals that have been documented are patient reported.    Cardiac Risk Factors include: advanced age (>12men, >44 women)     Objective:    Today's Vitals   05/30/23 1357  Weight: 111 lb (50.3 kg)  Height: 5' 3 (1.6 m)   Body mass index is 19.66 kg/m.     05/30/2023    2:02 PM 06/08/2022    1:40 PM  Advanced Directives  Does Patient Have a Medical Advance Directive? Yes Yes  Type of Estate Agent of [place];Living will Healthcare Power of [place];Living will  Does patient want to make changes to medical advance directive?  No - Patient declined  Copy of Healthcare Power of Attorney in Chart? No - copy requested No - copy requested    Current Medications (verified) Outpatient Encounter Medications as of 05/30/2023  Medication Sig   Biotin 10 MG TABS Take 1 tablet by mouth at bedtime.   Calcium Carb-Cholecalciferol (CALCIUM 500/D) 500-200 MG-UNIT TABS Take by mouth. One in the morning and one in the evening   cholecalciferol (VITAMIN D ) 1000 UNITS tablet Take 1,000 Units by mouth daily. Reported on 07/04/2015   levothyroxine  (SYNTHROID ) 75 MCG tablet Take 1 tablet (75 mcg total) by mouth daily before breakfast.   LUTEIN-ZEAXANTHIN PO Take 1 tablet by mouth daily.    Misc Natural  Products (OSTEO BI-FLEX TRIPLE STRENGTH PO) Take 1 tablet by mouth 2 (two) times daily. Osteo Bi-Flex   Multiple Vitamin (MULTIVITAMIN) tablet Take 1 tablet by mouth daily.   NON FORMULARY Take 2 tablets by mouth every morning. Omega Q Plus   NON FORMULARY Co-Q10 100mg  Take 2 tsp in the morning.   Omega-3 Fatty Acids (OMEGA-3 1450 PO) Take 2 tablets by mouth daily.   OVER THE COUNTER MEDICATION Tumeric Curcumin 500 mg one in morning and one in Evening   No facility-administered encounter medications on file as of 05/30/2023.    Allergies (verified) Penicillins and Fosamax  [alendronate  sodium]   History: Past Medical History:  Diagnosis Date   Arthritis    BCC (basal cell carcinoma of skin) 07/2017   R leg   Hypothyroidism 10/10/2009   Qualifier: Diagnosis of  By: Tish MD, Elsie   04/27/2009 TSH 7.66, free T4 0.86, free T3 2.5  01/02/13 Synthroid  increased to 25 mcg one daily except 1-1/2 pills Tuesday and Thursday. Repeat TSH recommended 4-6 months after increase.    OSTEOPOROSIS 12/22/2007   Tremor 07/21/2013   Past Surgical History:  Procedure Laterality Date   colonoscopy with polypectomy  2009   Dr Teressa    ENDOMETRIAL ABLATION     laparoscopic   varicose vein treatment Right 05-2015   Family History  Problem Relation Age of Onset   Breast cancer Mother        died 2020/03/16   Osteoporosis Mother    Colon cancer Maternal  Aunt    Heart disease Maternal Uncle        MI ? age   Lung cancer Maternal Uncle    Stroke Maternal Grandmother        in 38s   Stroke Maternal Grandfather        in 6s   Osteoporosis Maternal Aunt         X 2   Diabetes Neg Hx    Liver cancer Neg Hx    Pancreatic cancer Neg Hx    Rectal cancer Neg Hx    Stomach cancer Neg Hx    Social History   Socioeconomic History   Marital status: Married    Spouse name: Not on file   Number of children: 0   Years of education: Not on file   Highest education level: Not on file  Occupational History    Occupation: retired -- nurse, learning disability   Tobacco Use   Smoking status: Former    Current packs/day: 0.00    Types: Cigarettes    Quit date: 04/24/1983    Years since quitting: 40.1   Smokeless tobacco: Never   Tobacco comments:    smoked 1975- 1985, up to 1 ppd  Substance and Sexual Activity   Alcohol use: Yes    Comment:  occasionally   Drug use: Not on file   Sexual activity: Not on file  Other Topics Concern   Not on file  Social History Narrative   Has 1 step daughter    Social Drivers of Corporate Investment Banker Strain: Low Risk  (05/30/2023)   Overall Financial Resource Strain (CARDIA)    Difficulty of Paying Living Expenses: Not hard at all  Food Insecurity: No Food Insecurity (05/30/2023)   Hunger Vital Sign    Worried About Running Out of Food in the Last Year: Never true    Ran Out of Food in the Last Year: Never true  Transportation Needs: No Transportation Needs (05/30/2023)   PRAPARE - Administrator, Civil Service (Medical): No    Lack of Transportation (Non-Medical): No  Physical Activity: Insufficiently Active (05/30/2023)   Exercise Vital Sign    Days of Exercise per Week: 4 days    Minutes of Exercise per Session: 30 min  Stress: No Stress Concern Present (05/30/2023)   [employer] of Occupational Health - Occupational Stress Questionnaire    Feeling of Stress : Not at all  Social Connections: Socially Integrated (05/30/2023)   Social Connection and Isolation Panel [NHANES]    Frequency of Communication with Friends and Family: More than three times a week    Frequency of Social Gatherings with Friends and Family: More than three times a week    Attends Religious Services: More than 4 times per year    Active Member of [employer] or Organizations: Yes    Attends Engineer, Structural: More than 4 times per year    Marital Status: Married    Tobacco Counseling Counseling given: Not Answered Tobacco comments: smoked 1975- 1985, up  to 1 ppd   Clinical Intake:  Pre-visit preparation completed: Yes  Pain : No/denies pain     BMI - recorded: 19.66 Nutritional Status: BMI of 19-24  Normal Nutritional Risks: None Diabetes: No  How often do you need to have someone help you when you read instructions, pamphlets, or other written materials from your doctor or pharmacy?: 1 - Never  Interpreter Needed?: No  Information entered by :: Rojelio Blush  LPN   Activities of Daily Living    05/30/2023    2:02 PM 06/08/2022    1:46 PM  In your present state of health, do you have any difficulty performing the following activities:  Hearing? 0 0  Vision? 0 0  Difficulty concentrating or making decisions? 0 0  Walking or climbing stairs? 0 0  Dressing or bathing? 0 0  Doing errands, shopping? 0 0  Preparing Food and eating ? N N  Using the Toilet? N N  In the past six months, have you accidently leaked urine? N N  Do you have problems with loss of bowel control? N N  Managing your Medications? N N  Managing your Finances? N N  Housekeeping or managing your Housekeeping? N N    Patient Care Team: Amon Aloysius BRAVO, MD as PCP - General (Internal Medicine) Odetta Bolster (Gynecology) Tobie Tonita POUR, DO as Consulting Physician (Neurology) Maranda Leim DEL, MD as Consulting Physician (Cardiology) Ivin Kocher, MD as Consulting Physician (Dermatology) Patty Anes, MD as Consulting Physician (Family Medicine)  Indicate any recent Medical Services you may have received from other than [facility] providers in the past year (date may be approximate).     Assessment:   This is a routine wellness examination for Tris.  Hearing/Vision screen Hearing Screening - Comments:: Denies hearing difficulties   Vision Screening - Comments:: Wears rx glasses - up to date with routine eye exams with  Dr Marcey   Goals Addressed               This Visit's Progress     Stay Active (pt-stated)         Depression  Screen    05/30/2023    2:01 PM 05/14/2023    8:56 AM 06/08/2022    1:46 PM 04/05/2022   10:05 AM 04/06/2021   10:00 AM 02/03/2019    1:38 PM 01/21/2018    9:32 AM  PHQ 2/9 Scores  PHQ - 2 Score 0 0 0 0 0 0 0    Fall Risk    05/30/2023    2:02 PM 05/14/2023    8:56 AM 06/08/2022    1:40 PM 04/05/2022   10:06 AM 04/06/2021   10:00 AM  Fall Risk   Falls in the past year? 0 0 0 0 0  Number falls in past yr: 0 0 0 0 0  Injury with Fall? 0 0 0 0 0  Risk for fall due to : No Fall Risks  No Fall Risks    Follow up Falls prevention discussed;Falls evaluation completed Falls evaluation completed;Education provided Falls evaluation completed Falls evaluation completed Falls evaluation completed    MEDICARE RISK AT HOME: Medicare Risk at Home Any stairs in or around the home?: Yes If so, are there any without handrails?: No Home free of loose throw rugs in walkways, pet beds, electrical cords, etc?: Yes Adequate lighting in your home to reduce risk of falls?: Yes Life alert?: No Use of a cane, walker or w/c?: No Grab bars in the bathroom?: Yes Shower chair or bench in shower?: Yes Elevated toilet seat or a handicapped toilet?: No  TIMED UP AND GO:  Was the test performed?  No    Cognitive Function:        05/30/2023    2:03 PM 06/08/2022    1:55 PM  6CIT Screen  What Year? 0 points 0 points  What month? 0 points 0 points  What time? 0 points 0  points  Count back from 20 0 points 0 points  Months in reverse 0 points 0 points  Repeat phrase 0 points 0 points  Total Score 0 points 0 points    Immunizations Immunization History  Administered Date(s) Administered   Fluad Quad(high Dose 65+) 02/15/2022   Fluad Trivalent(High Dose 65+) 02/05/2023   Influenza Split 02/01/2011, 02/05/2012   Influenza Whole 01/09/2010   Influenza,inj,Quad PF,6+ Mos 02/05/2013, 01/05/2014, 01/10/2015, 01/12/2016, 01/15/2017, 01/21/2018, 01/15/2019, 04/04/2020, 02/09/2021   PFIZER(Purple  Top)SARS-COV-2 Vaccination 09/24/2019, 10/16/2019, 10/10/2020   Td 09/21/2008   Tdap 02/06/2019    TDAP status: Up to date    Pneumococcal vaccine status: Due, Education has been provided regarding the importance of this vaccine. Advised may receive this vaccine at local pharmacy or Health Dept. Aware to provide a copy of the vaccination record if obtained from local pharmacy or Health Dept. Verbalized acceptance and understanding.  Covid-19 vaccine status: Declined, Education has been provided regarding the importance of this vaccine but patient still declined. Advised may receive this vaccine at local pharmacy or Health Dept.or vaccine clinic. Aware to provide a copy of the vaccination record if obtained from local pharmacy or Health Dept. Verbalized acceptance and understanding.  Qualifies for Shingles Vaccine? Yes   Zostavax completed No   Shingrix Completed?: No.    Education has been provided regarding the importance of this vaccine. Patient has been advised to call insurance company to determine out of pocket expense if they have not yet received this vaccine. Advised may also receive vaccine at local pharmacy or Health Dept. Verbalized acceptance and understanding.  Screening Tests Health Maintenance  Topic Date Due   Zoster Vaccines- Shingrix (1 of 2) Never done   Pneumonia Vaccine 44+ Years old (1 of 1 - PCV) Never done   COVID-19 Vaccine (4 - 2024-25 season) 12/23/2022   Medicare Annual Wellness (AWV)  05/29/2024   MAMMOGRAM  08/05/2024   Colonoscopy  10/16/2027   DTaP/Tdap/Td (3 - Td or Tdap) 02/05/2029   INFLUENZA VACCINE  Completed   DEXA SCAN  Completed   Hepatitis C Screening  Completed   HPV VACCINES  Aged Out    Health Maintenance  Health Maintenance Due  Topic Date Due   Zoster Vaccines- Shingrix (1 of 2) Never done   Pneumonia Vaccine 72+ Years old (1 of 1 - PCV) Never done   COVID-19 Vaccine (4 - 2024-25 season) 12/23/2022    Colorectal cancer screening:  Type of screening: Colonoscopy. Completed 10/15/17. Repeat every 10 years  Mammogram status: Completed 08/06/22. Repeat every year  Bone Density status: Completed 01/28/15. Results reflect: Bone density results: OSTEOPOROSIS. Repeat every   years.   Additional Screening:  Hepatitis C Screening: does qualify; Completed 01/10/15  Vision Screening: Recommended annual ophthalmology exams for early detection of glaucoma and other disorders of the eye. Is the patient up to date with their annual eye exam?  Yes  Who is the provider or what is the name of the office in which the patient attends annual eye exams? Dr Marcey If pt is not established with a provider, would they like to be referred to a provider to establish care? No .   Dental Screening: Recommended annual dental exams for proper oral hygiene    Community Resource Referral / Chronic Care Management:  CRR required this visit?  No   CCM required this visit?  No     Plan:     I have personally reviewed and noted the following in  the patient's chart:   Medical and social history Use of alcohol, tobacco or illicit drugs  Current medications and supplements including opioid prescriptions. Patient is not currently taking opioid prescriptions. Functional ability and status Nutritional status Physical activity Advanced directives List of other physicians Hospitalizations, surgeries, and ER visits in previous 12 months Vitals Screenings to include cognitive, depression, and falls Referrals and appointments  In addition, I have reviewed and discussed with patient certain preventive protocols, quality metrics, and best practice recommendations. A written personalized care plan for preventive services as well as general preventive health recommendations were provided to patient.     Rojelio LELON Blush, LPN   10/23/7972   After Visit Summary: (MyChart) Due to this being a telephonic visit, the after visit summary with patients  personalized plan was offered to patient via MyChart   Nurse Notes: None

## 2023-05-30 NOTE — Patient Instructions (Addendum)
 Candice Brennan , Thank you for taking time to come for your Medicare Wellness Visit. I appreciate your ongoing commitment to your health goals. Please review the following plan we discussed and let me know if I can assist you in the future.   Referrals/Orders/Follow-Ups/Clinician Recommendations:   This is a list of the screening recommended for you and due dates:  Health Maintenance  Topic Date Due   Zoster (Shingles) Vaccine (1 of 2) Never done   Pneumonia Vaccine (1 of 1 - PCV) Never done   COVID-19 Vaccine (4 - 2024-25 season) 12/23/2022   Medicare Annual Wellness Visit  05/29/2024   Mammogram  08/05/2024   Colon Cancer Screening  10/16/2027   DTaP/Tdap/Td vaccine (3 - Td or Tdap) 02/05/2029   Flu Shot  Completed   DEXA scan (bone density measurement)  Completed   Hepatitis C Screening  Completed   HPV Vaccine  Aged Out    Advanced directives: (Copy Requested) Please bring a copy of your health care power of attorney and living will to the office to be added to your chart at your convenience.  Next Medicare Annual Wellness Visit scheduled for next year: Yes

## 2023-06-28 ENCOUNTER — Other Ambulatory Visit: Payer: Self-pay | Admitting: Internal Medicine

## 2023-06-28 DIAGNOSIS — Z1231 Encounter for screening mammogram for malignant neoplasm of breast: Secondary | ICD-10-CM

## 2023-07-05 ENCOUNTER — Other Ambulatory Visit: Payer: Self-pay | Admitting: Internal Medicine

## 2023-08-06 DIAGNOSIS — G43109 Migraine with aura, not intractable, without status migrainosus: Secondary | ICD-10-CM | POA: Diagnosis not present

## 2023-08-06 DIAGNOSIS — H35371 Puckering of macula, right eye: Secondary | ICD-10-CM | POA: Diagnosis not present

## 2023-08-06 DIAGNOSIS — H43813 Vitreous degeneration, bilateral: Secondary | ICD-10-CM | POA: Diagnosis not present

## 2023-08-06 DIAGNOSIS — H2513 Age-related nuclear cataract, bilateral: Secondary | ICD-10-CM | POA: Diagnosis not present

## 2023-08-06 DIAGNOSIS — H59811 Chorioretinal scars after surgery for detachment, right eye: Secondary | ICD-10-CM | POA: Diagnosis not present

## 2023-08-08 ENCOUNTER — Ambulatory Visit
Admission: RE | Admit: 2023-08-08 | Discharge: 2023-08-08 | Disposition: A | Discharge status: Home / Self Care | Source: Ambulatory Visit | Attending: Internal Medicine | Admitting: Internal Medicine

## 2023-08-08 DIAGNOSIS — Z1231 Encounter for screening mammogram for malignant neoplasm of breast: Secondary | ICD-10-CM | POA: Diagnosis not present

## 2023-11-06 ENCOUNTER — Telehealth: Payer: Self-pay | Admitting: Internal Medicine

## 2023-11-06 DIAGNOSIS — E785 Hyperlipidemia, unspecified: Secondary | ICD-10-CM

## 2023-11-06 NOTE — Telephone Encounter (Signed)
 This pt need a lab order for appt

## 2023-11-06 NOTE — Telephone Encounter (Signed)
Chart reviewed  Order placed

## 2023-11-11 ENCOUNTER — Other Ambulatory Visit (INDEPENDENT_AMBULATORY_CARE_PROVIDER_SITE_OTHER): Payer: Medicare Other

## 2023-11-11 DIAGNOSIS — E785 Hyperlipidemia, unspecified: Secondary | ICD-10-CM | POA: Diagnosis not present

## 2023-11-11 LAB — LIPID PANEL
Cholesterol: 241 mg/dL — ABNORMAL HIGH (ref 0–200)
HDL: 94.3 mg/dL (ref >39.00)
LDL Cholesterol: 136 mg/dL — ABNORMAL HIGH (ref 0–99)
NonHDL: 146.28
Total CHOL/HDL Ratio: 3
Triglycerides: 51 mg/dL (ref 0.0–149.0)
VLDL: 10.2 mg/dL (ref 0.0–40.0)

## 2023-11-12 ENCOUNTER — Ambulatory Visit: Payer: Self-pay | Admitting: Internal Medicine

## 2023-11-14 DIAGNOSIS — H43813 Vitreous degeneration, bilateral: Secondary | ICD-10-CM | POA: Diagnosis not present

## 2023-11-14 DIAGNOSIS — H2513 Age-related nuclear cataract, bilateral: Secondary | ICD-10-CM | POA: Diagnosis not present

## 2023-11-14 DIAGNOSIS — D3131 Benign neoplasm of right choroid: Secondary | ICD-10-CM | POA: Diagnosis not present

## 2023-11-14 DIAGNOSIS — H31091 Other chorioretinal scars, right eye: Secondary | ICD-10-CM | POA: Diagnosis not present

## 2023-11-18 DIAGNOSIS — H35371 Puckering of macula, right eye: Secondary | ICD-10-CM | POA: Diagnosis not present

## 2023-11-18 DIAGNOSIS — G43109 Migraine with aura, not intractable, without status migrainosus: Secondary | ICD-10-CM | POA: Diagnosis not present

## 2023-11-18 DIAGNOSIS — H2513 Age-related nuclear cataract, bilateral: Secondary | ICD-10-CM | POA: Diagnosis not present

## 2023-11-18 DIAGNOSIS — H31091 Other chorioretinal scars, right eye: Secondary | ICD-10-CM | POA: Diagnosis not present

## 2023-11-18 DIAGNOSIS — H43813 Vitreous degeneration, bilateral: Secondary | ICD-10-CM | POA: Diagnosis not present

## 2023-12-27 ENCOUNTER — Other Ambulatory Visit: Payer: Self-pay | Admitting: Internal Medicine

## 2024-02-20 ENCOUNTER — Ambulatory Visit

## 2024-02-24 ENCOUNTER — Ambulatory Visit (INDEPENDENT_AMBULATORY_CARE_PROVIDER_SITE_OTHER)

## 2024-02-24 DIAGNOSIS — Z23 Encounter for immunization: Secondary | ICD-10-CM

## 2024-03-24 ENCOUNTER — Other Ambulatory Visit: Payer: Self-pay | Admitting: Family

## 2024-05-18 ENCOUNTER — Encounter: Payer: Medicare Other | Admitting: Internal Medicine

## 2024-06-04 ENCOUNTER — Ambulatory Visit: Payer: Medicare Other

## 2024-10-05 ENCOUNTER — Encounter: Admitting: Internal Medicine
